# Patient Record
Sex: Male | Born: 1950 | Race: White | Hispanic: No | Marital: Single | Smoking: Never smoker
Health system: Southern US, Community
[De-identification: ages and names within clinical notes are randomized; demographics above are authoritative.]

---

## 2015-01-29 ENCOUNTER — Other Ambulatory Visit (HOSPITAL_COMMUNITY): Payer: Self-pay | Admitting: Urology

## 2015-01-29 DIAGNOSIS — R972 Elevated prostate specific antigen [PSA]: Secondary | ICD-10-CM

## 2015-02-16 ENCOUNTER — Ambulatory Visit (HOSPITAL_COMMUNITY)
Admission: RE | Admit: 2015-02-16 | Discharge: 2015-02-16 | Disposition: A | Discharge status: Home / Self Care | Payer: PRIVATE HEALTH INSURANCE | Source: Ambulatory Visit | Attending: Urology | Admitting: Urology

## 2015-02-16 DIAGNOSIS — R972 Elevated prostate specific antigen [PSA]: Secondary | ICD-10-CM | POA: Diagnosis not present

## 2015-02-16 DIAGNOSIS — N4289 Other specified disorders of prostate: Secondary | ICD-10-CM | POA: Diagnosis not present

## 2015-02-16 LAB — POCT I-STAT, CHEM 8
BUN: 11 mg/dL (ref 6–20)
CALCIUM ION: 1.12 mmol/L — AB (ref 1.13–1.30)
Chloride: 102 mmol/L (ref 101–111)
Creatinine, Ser: 0.9 mg/dL (ref 0.61–1.24)
GLUCOSE: 89 mg/dL (ref 65–99)
HEMATOCRIT: 43 % (ref 39.0–52.0)
HEMOGLOBIN: 14.6 g/dL (ref 13.0–17.0)
POTASSIUM: 4.4 mmol/L (ref 3.5–5.1)
Sodium: 140 mmol/L (ref 135–145)
TCO2: 28 mmol/L (ref 0–100)

## 2015-02-16 MED ORDER — GADOBENATE DIMEGLUMINE 529 MG/ML IV SOLN
16.0000 mL | Freq: Once | INTRAVENOUS | Status: AC | PRN
  Administered 2015-02-16: 16 mL via INTRAVENOUS

## 2015-10-18 DIAGNOSIS — J019 Acute sinusitis, unspecified: Secondary | ICD-10-CM | POA: Diagnosis not present

## 2015-10-25 DIAGNOSIS — R972 Elevated prostate specific antigen [PSA]: Secondary | ICD-10-CM | POA: Diagnosis not present

## 2015-10-25 DIAGNOSIS — N401 Enlarged prostate with lower urinary tract symptoms: Secondary | ICD-10-CM | POA: Diagnosis not present

## 2015-11-18 DIAGNOSIS — I10 Essential (primary) hypertension: Secondary | ICD-10-CM | POA: Diagnosis not present

## 2016-01-18 DIAGNOSIS — Z0000 Encounter for general adult medical examination without abnormal findings: Secondary | ICD-10-CM | POA: Diagnosis not present

## 2016-01-18 DIAGNOSIS — Z6825 Body mass index (BMI) 25.0-25.9, adult: Secondary | ICD-10-CM | POA: Diagnosis not present

## 2016-01-18 DIAGNOSIS — Z9181 History of falling: Secondary | ICD-10-CM | POA: Diagnosis not present

## 2016-01-18 DIAGNOSIS — Z79899 Other long term (current) drug therapy: Secondary | ICD-10-CM | POA: Diagnosis not present

## 2016-01-18 DIAGNOSIS — E785 Hyperlipidemia, unspecified: Secondary | ICD-10-CM | POA: Diagnosis not present

## 2016-01-18 DIAGNOSIS — Z1389 Encounter for screening for other disorder: Secondary | ICD-10-CM | POA: Diagnosis not present

## 2016-01-18 DIAGNOSIS — I10 Essential (primary) hypertension: Secondary | ICD-10-CM | POA: Diagnosis not present

## 2016-01-18 DIAGNOSIS — R739 Hyperglycemia, unspecified: Secondary | ICD-10-CM | POA: Diagnosis not present

## 2016-01-18 DIAGNOSIS — N400 Enlarged prostate without lower urinary tract symptoms: Secondary | ICD-10-CM | POA: Diagnosis not present

## 2016-02-02 DIAGNOSIS — H25813 Combined forms of age-related cataract, bilateral: Secondary | ICD-10-CM | POA: Diagnosis not present

## 2016-02-02 DIAGNOSIS — I10 Essential (primary) hypertension: Secondary | ICD-10-CM | POA: Diagnosis not present

## 2016-04-24 DIAGNOSIS — R972 Elevated prostate specific antigen [PSA]: Secondary | ICD-10-CM | POA: Diagnosis not present

## 2016-04-24 DIAGNOSIS — N401 Enlarged prostate with lower urinary tract symptoms: Secondary | ICD-10-CM | POA: Diagnosis not present

## 2016-07-07 DIAGNOSIS — R69 Illness, unspecified: Secondary | ICD-10-CM | POA: Diagnosis not present

## 2016-07-18 IMAGING — MR MR PROSTATE WO/W CM
23 of 53 series · 23 of 53 positions shown · IV contrast (yes)
Comparison: None.

CLINICAL DATA: Persistent elevated serum PSA levels. Prior negative
prostate biopsy.

EXAM:
MR PROSTATE WITHOUT AND WITH CONTRAST
TECHNIQUE: Multiplanar multisequence MRI images were obtained of the pelvis
centered about the prostate. Pre and post contrast images were
obtained.
CONTRAST:  16mL MULTIHANCE GADOBENATE DIMEGLUMINE 529 MG/ML IV SOLN

[Series 3: bSSFP fat-sat · axial · 6.0mm · 0.86mm/px · 1 of 44 slices shown]
[im 1/44]
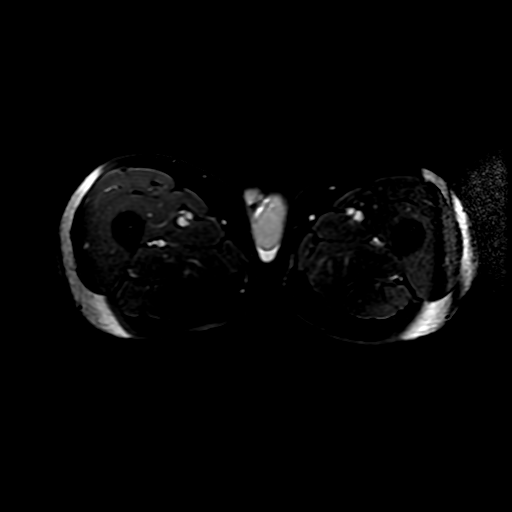

[Series 4: T1 · axial · 6.0mm · 0.86mm/px · 1 of 43 slices shown (1 of 2)]
[im 1/43]
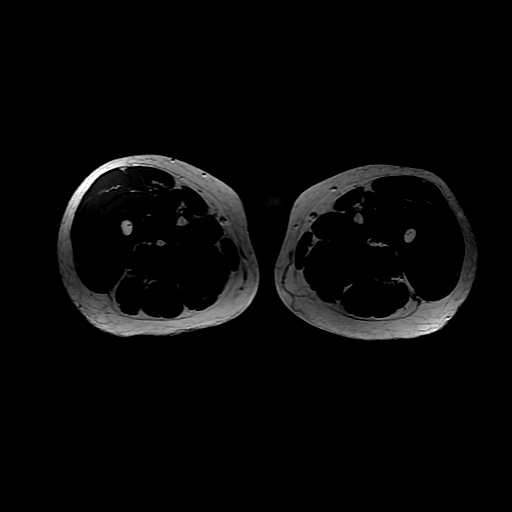

[Series 5: T2 · axial · 3.0mm · 0.29mm/px · 1 of 30 slices shown (1 of 3)]
[im 1/30]
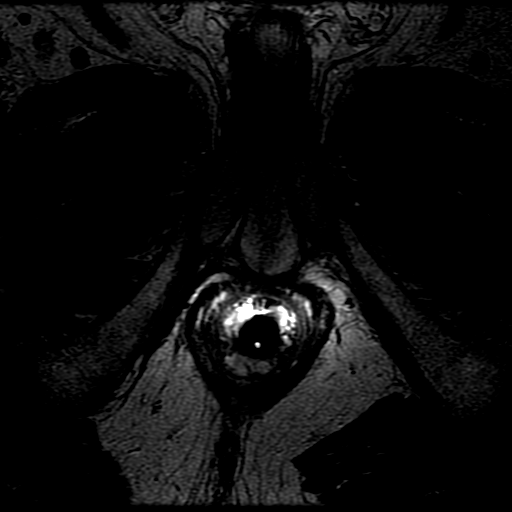

[Series 6: T1 · axial · 3.0mm · 0.29mm/px · 1 of 30 slices shown (2 of 2)]
[im 1/30]
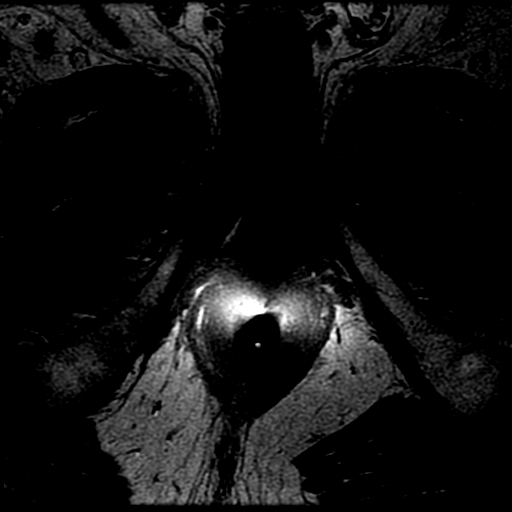

[Series 7: T2 · sagittal · 4.0mm · 0.29mm/px · 1 of 25 slices shown (2 of 3)]
[im 1/25]
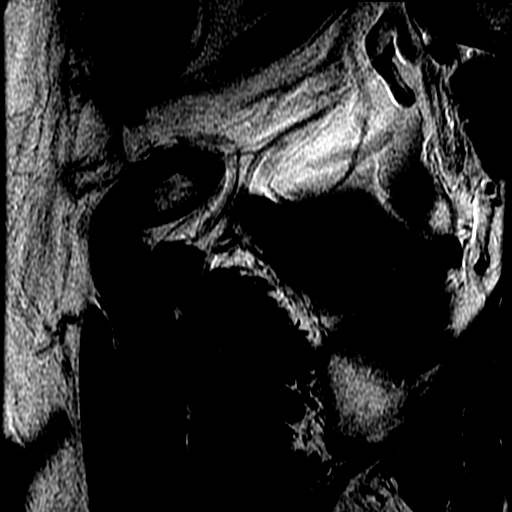

[Series 8: T2 · coronal · 4.0mm · 0.29mm/px · 1 of 18 slices shown (3 of 3)]
[im 1/18]
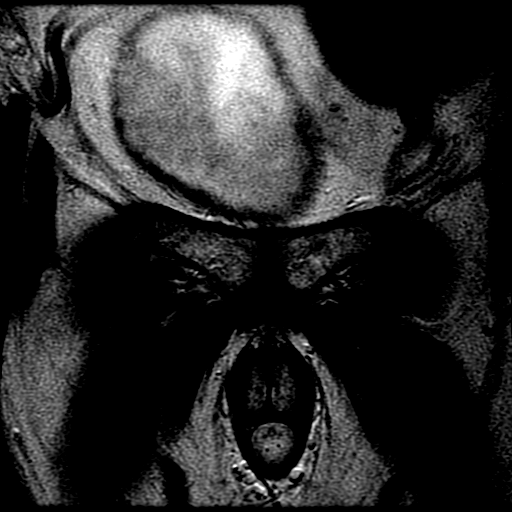

[Series 9: DWI · axial · 3.0mm · 0.59mm/px · 1 of 50 slices shown (1 of 6)]
[im 1/50]
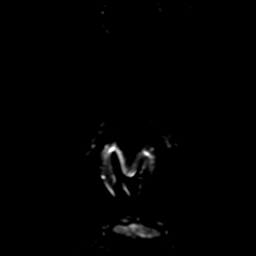

[Series 10: DWI · axial · 3.0mm · 0.59mm/px · 1 of 48 slices shown (2 of 6)]
[im 1/48]
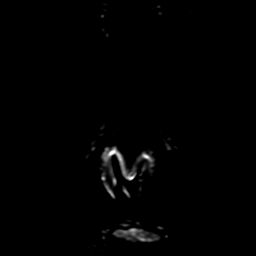

[Series 11: DWI · axial · 3.0mm · 0.59mm/px · 1 of 47 slices shown (3 of 6)]
[im 1/47]
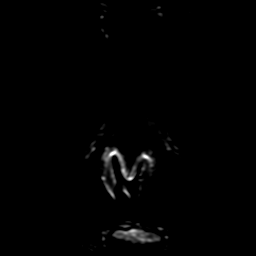

[Series 900: DWI · axial · 3.0mm · 0.59mm/px · 1 of 25 slices shown (4 of 6)]
[im 1/25]
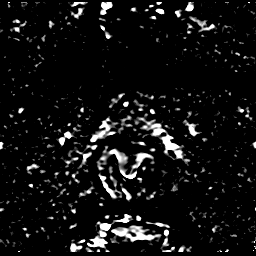

[Series 1000: DWI · axial · 3.0mm · 0.59mm/px · 1 of 25 slices shown (5 of 6)]
[im 1/25]
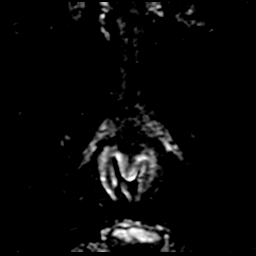

[Series 1100: DWI · axial · 3.0mm · 0.59mm/px · 1 of 25 slices shown (6 of 6)]
[im 1/25]
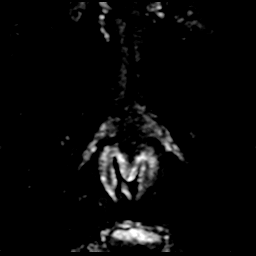

[((id)/(id)/1)-((id)/(id)/1) · axial · 3.0mm · 0.43mm/px · 1 of 75 slices shown (1 of 11)]
[im 1/75]
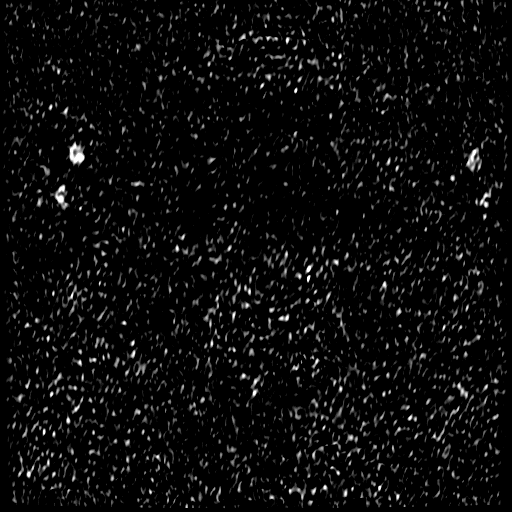

[((id)/(id)/1)-((id)/(id)/1) · axial · 3.0mm · 0.43mm/px · 1 of 68 slices shown (2 of 11)]
[im 1/68]
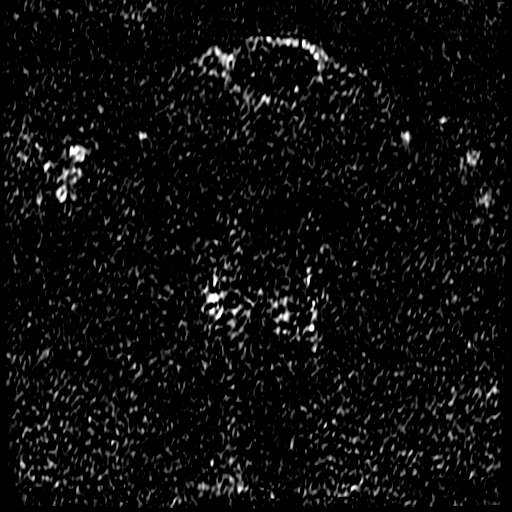

[((id)/(id)/1)-((id)/(id)/1) · axial · 3.0mm · 0.43mm/px · 1 of 69 slices shown (3 of 11)]
[im 1/69]
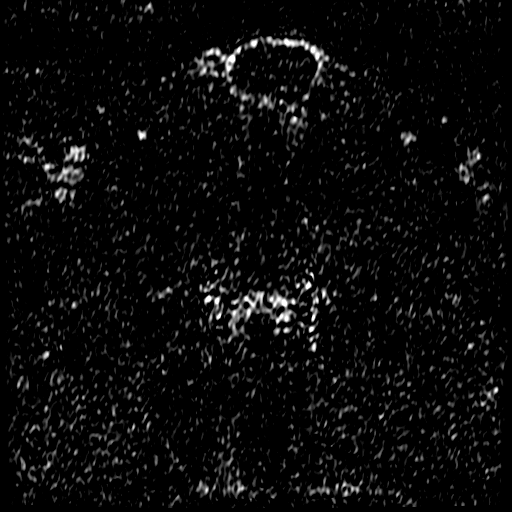

[((id)/(id)/1)-((id)/(id)/1) · axial · 3.0mm · 0.43mm/px · 1 of 70 slices shown (4 of 11)]
[im 1/70]
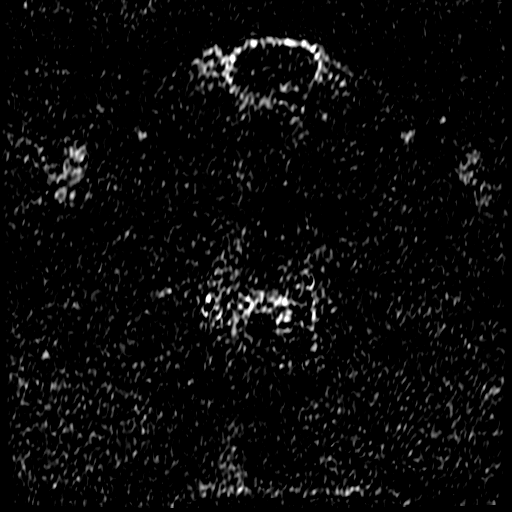

[((id)/(id)/1)-((id)/(id)/1) · axial · 3.0mm · 0.43mm/px · 1 of 70 slices shown (5 of 11)]
[im 1/70]
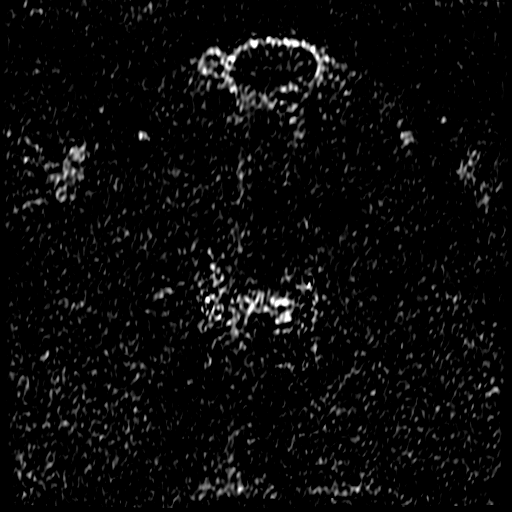

[((id)/(id)/1)-((id)/(id)/1) · axial · 3.0mm · 0.43mm/px · 1 of 71 slices shown (6 of 11)]
[im 1/71]
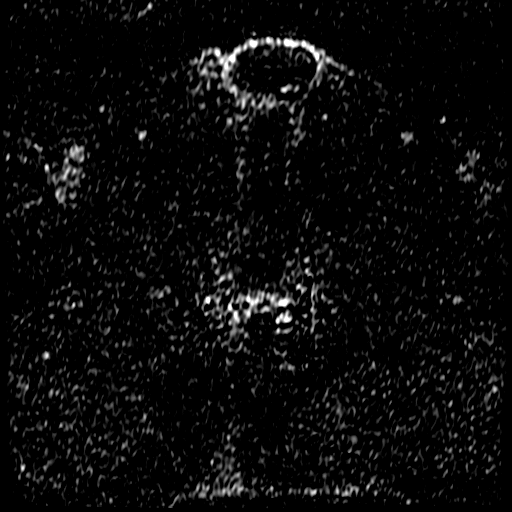

[((id)/(id)/1)-((id)/(id)/1) · axial · 3.0mm · 0.43mm/px · 1 of 72 slices shown (7 of 11)]
[im 1/72]
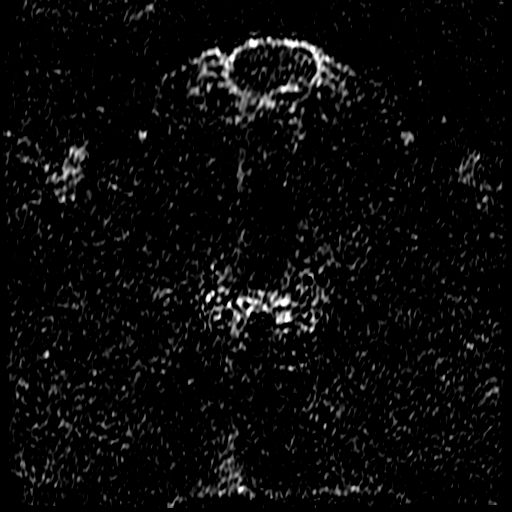

[((id)/(id)/1)-((id)/(id)/1) · axial · 3.0mm · 0.43mm/px · 1 of 72 slices shown (8 of 11)]
[im 1/72]
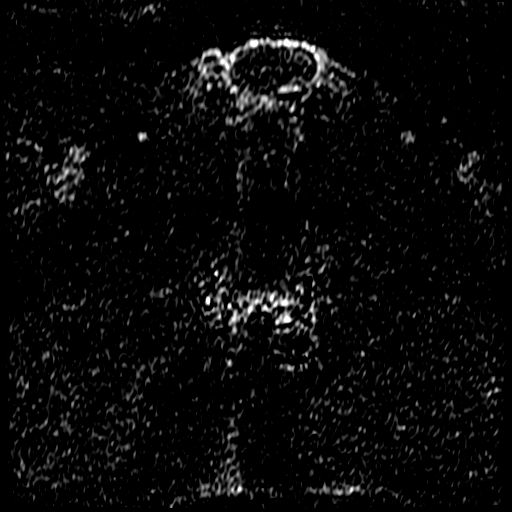

[((id)/(id)/1)-((id)/(id)/1) · axial · 3.0mm · 0.43mm/px · 1 of 75 slices shown (9 of 11)]
[im 1/75]
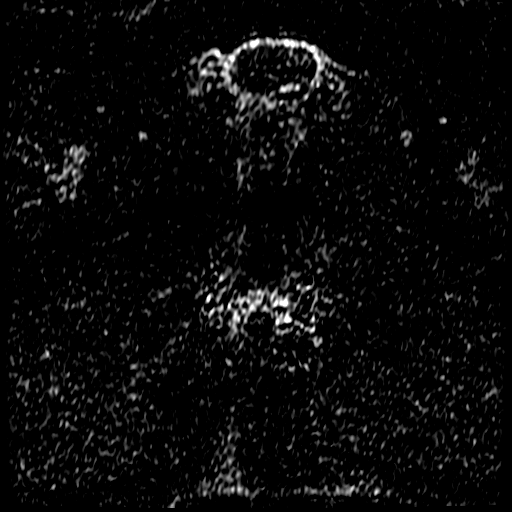

[((id)/(id)/1)-((id)/(id)/1) · axial · 3.0mm · 0.43mm/px · 1 of 75 slices shown (10 of 11)]
[im 1/75]
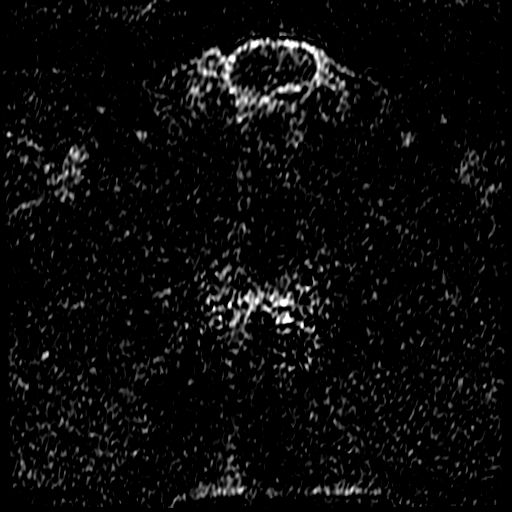

[((id)/(id)/1)-((id)/(id)/1) · axial · 3.0mm · 0.43mm/px · 1 of 75 slices shown (11 of 11)]
[im 1/75]
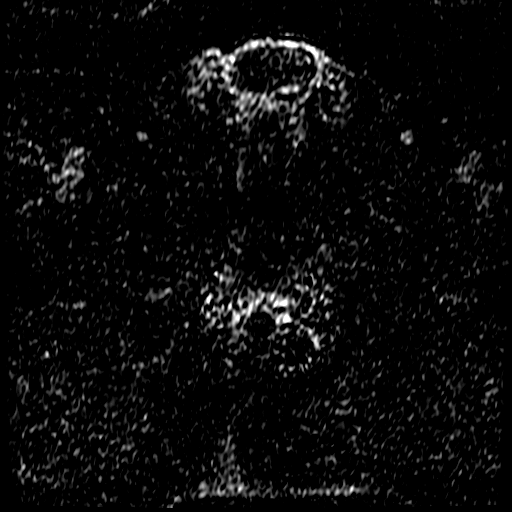

[23 of 53 positions shown; findings below may reference images not displayed]

FINDINGS: Prostate: Mildly enlarged. Multiple small BPH nodules seen
throughout the central gland, without suspicious features. Small
benign prostatic utricle cyst also seen posteriorly measuring 9 mm.

The right sided peripheral zone shows diffuse atrophy, without
evidence of focal T2 hypointense or hypervascular nodule. No
evidence of ADC restricted diffusion.

The left sided peripheral zone is normal in appearance, without
evidence of focal nodules, ADC restricted diffusion, or
hypervascularity.

Transcapsular spread:  Absent

Seminal vesicle involvement: Absent

Neurovascular bundle involvement: Absent

Pelvic adenopathy: Absent

Bone metastasis: Absent

Other findings: None
IMPRESSION: No evidence of clinically significant macroscopic prostate
carcinoma.

Diffuse right sided peripheral zone atrophy, consistent with chronic
prostatitis.

## 2016-08-04 DIAGNOSIS — H5203 Hypermetropia, bilateral: Secondary | ICD-10-CM | POA: Diagnosis not present

## 2016-08-04 DIAGNOSIS — Z0100 Encounter for examination of eyes and vision without abnormal findings: Secondary | ICD-10-CM | POA: Diagnosis not present

## 2016-08-08 DIAGNOSIS — R42 Dizziness and giddiness: Secondary | ICD-10-CM | POA: Diagnosis not present

## 2016-08-08 DIAGNOSIS — M12812 Other specific arthropathies, not elsewhere classified, left shoulder: Secondary | ICD-10-CM | POA: Diagnosis not present

## 2016-10-25 DIAGNOSIS — R972 Elevated prostate specific antigen [PSA]: Secondary | ICD-10-CM | POA: Diagnosis not present

## 2016-10-25 DIAGNOSIS — N401 Enlarged prostate with lower urinary tract symptoms: Secondary | ICD-10-CM | POA: Diagnosis not present

## 2017-02-05 DIAGNOSIS — N401 Enlarged prostate with lower urinary tract symptoms: Secondary | ICD-10-CM | POA: Diagnosis not present

## 2017-02-05 DIAGNOSIS — K4090 Unilateral inguinal hernia, without obstruction or gangrene, not specified as recurrent: Secondary | ICD-10-CM | POA: Diagnosis not present

## 2017-02-05 DIAGNOSIS — R972 Elevated prostate specific antigen [PSA]: Secondary | ICD-10-CM | POA: Diagnosis not present

## 2017-02-20 DIAGNOSIS — E7800 Pure hypercholesterolemia, unspecified: Secondary | ICD-10-CM | POA: Diagnosis not present

## 2017-02-20 DIAGNOSIS — K4090 Unilateral inguinal hernia, without obstruction or gangrene, not specified as recurrent: Secondary | ICD-10-CM | POA: Diagnosis not present

## 2017-02-20 DIAGNOSIS — I10 Essential (primary) hypertension: Secondary | ICD-10-CM | POA: Diagnosis not present

## 2017-02-20 DIAGNOSIS — E039 Hypothyroidism, unspecified: Secondary | ICD-10-CM | POA: Diagnosis not present

## 2017-02-26 DIAGNOSIS — E039 Hypothyroidism, unspecified: Secondary | ICD-10-CM | POA: Diagnosis not present

## 2017-02-26 DIAGNOSIS — Z0000 Encounter for general adult medical examination without abnormal findings: Secondary | ICD-10-CM | POA: Diagnosis not present

## 2017-02-26 DIAGNOSIS — R739 Hyperglycemia, unspecified: Secondary | ICD-10-CM | POA: Diagnosis not present

## 2017-02-26 DIAGNOSIS — N400 Enlarged prostate without lower urinary tract symptoms: Secondary | ICD-10-CM | POA: Diagnosis not present

## 2017-02-26 DIAGNOSIS — Z79899 Other long term (current) drug therapy: Secondary | ICD-10-CM | POA: Diagnosis not present

## 2017-02-26 DIAGNOSIS — E781 Pure hyperglyceridemia: Secondary | ICD-10-CM | POA: Diagnosis not present

## 2017-02-26 DIAGNOSIS — R972 Elevated prostate specific antigen [PSA]: Secondary | ICD-10-CM | POA: Diagnosis not present

## 2017-02-26 DIAGNOSIS — K219 Gastro-esophageal reflux disease without esophagitis: Secondary | ICD-10-CM | POA: Diagnosis not present

## 2017-02-26 DIAGNOSIS — Z6824 Body mass index (BMI) 24.0-24.9, adult: Secondary | ICD-10-CM | POA: Diagnosis not present

## 2017-02-26 DIAGNOSIS — I10 Essential (primary) hypertension: Secondary | ICD-10-CM | POA: Diagnosis not present

## 2017-04-25 DIAGNOSIS — N401 Enlarged prostate with lower urinary tract symptoms: Secondary | ICD-10-CM | POA: Diagnosis not present

## 2017-04-25 DIAGNOSIS — R972 Elevated prostate specific antigen [PSA]: Secondary | ICD-10-CM | POA: Diagnosis not present

## 2017-06-15 DIAGNOSIS — R69 Illness, unspecified: Secondary | ICD-10-CM | POA: Diagnosis not present

## 2017-08-08 DIAGNOSIS — R05 Cough: Secondary | ICD-10-CM | POA: Diagnosis not present

## 2017-08-08 DIAGNOSIS — Z6824 Body mass index (BMI) 24.0-24.9, adult: Secondary | ICD-10-CM | POA: Diagnosis not present

## 2017-08-08 DIAGNOSIS — J019 Acute sinusitis, unspecified: Secondary | ICD-10-CM | POA: Diagnosis not present

## 2017-08-08 DIAGNOSIS — R0982 Postnasal drip: Secondary | ICD-10-CM | POA: Diagnosis not present

## 2017-08-22 DIAGNOSIS — H5203 Hypermetropia, bilateral: Secondary | ICD-10-CM | POA: Diagnosis not present

## 2017-09-26 DIAGNOSIS — K4090 Unilateral inguinal hernia, without obstruction or gangrene, not specified as recurrent: Secondary | ICD-10-CM | POA: Diagnosis not present

## 2017-10-25 ENCOUNTER — Other Ambulatory Visit (HOSPITAL_COMMUNITY)
Admission: RE | Admit: 2017-10-25 | Discharge: 2017-10-25 | Disposition: A | Discharge status: Home / Self Care | Payer: PRIVATE HEALTH INSURANCE | Source: Ambulatory Visit | Attending: Family Medicine | Admitting: Family Medicine

## 2017-10-25 DIAGNOSIS — K4090 Unilateral inguinal hernia, without obstruction or gangrene, not specified as recurrent: Secondary | ICD-10-CM | POA: Diagnosis not present

## 2017-10-25 DIAGNOSIS — I10 Essential (primary) hypertension: Secondary | ICD-10-CM | POA: Diagnosis not present

## 2017-10-25 DIAGNOSIS — E039 Hypothyroidism, unspecified: Secondary | ICD-10-CM | POA: Diagnosis not present

## 2017-10-25 DIAGNOSIS — D176 Benign lipomatous neoplasm of spermatic cord: Secondary | ICD-10-CM | POA: Diagnosis not present

## 2017-10-25 DIAGNOSIS — E7800 Pure hypercholesterolemia, unspecified: Secondary | ICD-10-CM | POA: Diagnosis not present

## 2017-10-25 DIAGNOSIS — N401 Enlarged prostate with lower urinary tract symptoms: Secondary | ICD-10-CM | POA: Diagnosis not present

## 2017-10-25 DIAGNOSIS — Z791 Long term (current) use of non-steroidal anti-inflammatories (NSAID): Secondary | ICD-10-CM | POA: Diagnosis not present

## 2017-10-25 DIAGNOSIS — C8330 Diffuse large B-cell lymphoma, unspecified site: Secondary | ICD-10-CM | POA: Insufficient documentation

## 2017-10-25 DIAGNOSIS — Z79899 Other long term (current) drug therapy: Secondary | ICD-10-CM | POA: Diagnosis not present

## 2017-10-25 DIAGNOSIS — C8339 Diffuse large B-cell lymphoma, extranodal and solid organ sites: Secondary | ICD-10-CM | POA: Diagnosis not present

## 2017-10-25 DIAGNOSIS — M1990 Unspecified osteoarthritis, unspecified site: Secondary | ICD-10-CM | POA: Diagnosis not present

## 2017-10-25 DIAGNOSIS — K219 Gastro-esophageal reflux disease without esophagitis: Secondary | ICD-10-CM | POA: Diagnosis not present

## 2017-11-05 DIAGNOSIS — N401 Enlarged prostate with lower urinary tract symptoms: Secondary | ICD-10-CM | POA: Diagnosis not present

## 2017-11-05 DIAGNOSIS — R972 Elevated prostate specific antigen [PSA]: Secondary | ICD-10-CM | POA: Diagnosis not present

## 2017-11-07 DIAGNOSIS — Z09 Encounter for follow-up examination after completed treatment for conditions other than malignant neoplasm: Secondary | ICD-10-CM | POA: Diagnosis not present

## 2017-11-09 DIAGNOSIS — C8510 Unspecified B-cell lymphoma, unspecified site: Secondary | ICD-10-CM | POA: Diagnosis not present

## 2017-11-09 DIAGNOSIS — C8590 Non-Hodgkin lymphoma, unspecified, unspecified site: Secondary | ICD-10-CM | POA: Diagnosis not present

## 2017-11-09 DIAGNOSIS — I700 Atherosclerosis of aorta: Secondary | ICD-10-CM | POA: Diagnosis not present

## 2017-11-09 DIAGNOSIS — N210 Calculus in bladder: Secondary | ICD-10-CM | POA: Diagnosis not present

## 2017-11-13 DIAGNOSIS — Z79899 Other long term (current) drug therapy: Secondary | ICD-10-CM | POA: Diagnosis not present

## 2017-11-13 DIAGNOSIS — K219 Gastro-esophageal reflux disease without esophagitis: Secondary | ICD-10-CM | POA: Diagnosis not present

## 2017-11-13 DIAGNOSIS — R972 Elevated prostate specific antigen [PSA]: Secondary | ICD-10-CM | POA: Diagnosis not present

## 2017-11-13 DIAGNOSIS — N400 Enlarged prostate without lower urinary tract symptoms: Secondary | ICD-10-CM | POA: Diagnosis not present

## 2017-11-13 DIAGNOSIS — E785 Hyperlipidemia, unspecified: Secondary | ICD-10-CM | POA: Diagnosis not present

## 2017-11-13 DIAGNOSIS — E039 Hypothyroidism, unspecified: Secondary | ICD-10-CM | POA: Diagnosis not present

## 2017-11-13 DIAGNOSIS — C8333 Diffuse large B-cell lymphoma, intra-abdominal lymph nodes: Secondary | ICD-10-CM | POA: Diagnosis not present

## 2017-11-13 DIAGNOSIS — M1990 Unspecified osteoarthritis, unspecified site: Secondary | ICD-10-CM | POA: Diagnosis not present

## 2017-11-14 DIAGNOSIS — Z6824 Body mass index (BMI) 24.0-24.9, adult: Secondary | ICD-10-CM | POA: Diagnosis not present

## 2017-11-14 DIAGNOSIS — C8590 Non-Hodgkin lymphoma, unspecified, unspecified site: Secondary | ICD-10-CM | POA: Diagnosis not present

## 2017-11-21 DIAGNOSIS — I517 Cardiomegaly: Secondary | ICD-10-CM | POA: Diagnosis not present

## 2017-11-21 DIAGNOSIS — T451X1A Poisoning by antineoplastic and immunosuppressive drugs, accidental (unintentional), initial encounter: Secondary | ICD-10-CM | POA: Diagnosis not present

## 2017-11-21 DIAGNOSIS — C8330 Diffuse large B-cell lymphoma, unspecified site: Secondary | ICD-10-CM | POA: Diagnosis not present

## 2017-11-22 DIAGNOSIS — Z452 Encounter for adjustment and management of vascular access device: Secondary | ICD-10-CM | POA: Diagnosis not present

## 2017-11-22 DIAGNOSIS — N401 Enlarged prostate with lower urinary tract symptoms: Secondary | ICD-10-CM | POA: Diagnosis not present

## 2017-11-22 DIAGNOSIS — E039 Hypothyroidism, unspecified: Secondary | ICD-10-CM | POA: Diagnosis not present

## 2017-11-22 DIAGNOSIS — Z79899 Other long term (current) drug therapy: Secondary | ICD-10-CM | POA: Diagnosis not present

## 2017-11-22 DIAGNOSIS — C8590 Non-Hodgkin lymphoma, unspecified, unspecified site: Secondary | ICD-10-CM | POA: Diagnosis not present

## 2017-11-22 DIAGNOSIS — K219 Gastro-esophageal reflux disease without esophagitis: Secondary | ICD-10-CM | POA: Diagnosis not present

## 2017-11-23 DIAGNOSIS — C8330 Diffuse large B-cell lymphoma, unspecified site: Secondary | ICD-10-CM | POA: Diagnosis not present

## 2017-11-23 DIAGNOSIS — Z5111 Encounter for antineoplastic chemotherapy: Secondary | ICD-10-CM | POA: Diagnosis not present

## 2017-11-26 DIAGNOSIS — C8330 Diffuse large B-cell lymphoma, unspecified site: Secondary | ICD-10-CM | POA: Diagnosis not present

## 2017-11-26 DIAGNOSIS — Z5189 Encounter for other specified aftercare: Secondary | ICD-10-CM | POA: Diagnosis not present

## 2017-11-28 DIAGNOSIS — Z09 Encounter for follow-up examination after completed treatment for conditions other than malignant neoplasm: Secondary | ICD-10-CM | POA: Diagnosis not present

## 2017-11-30 DIAGNOSIS — C8338 Diffuse large B-cell lymphoma, lymph nodes of multiple sites: Secondary | ICD-10-CM | POA: Diagnosis not present

## 2017-11-30 DIAGNOSIS — R300 Dysuria: Secondary | ICD-10-CM | POA: Diagnosis not present

## 2017-12-03 ENCOUNTER — Encounter (HOSPITAL_COMMUNITY): Payer: Self-pay | Admitting: Surgery

## 2017-12-04 LAB — TISSUE HYBRIDIZATION TO NCBH

## 2017-12-07 DIAGNOSIS — M79662 Pain in left lower leg: Secondary | ICD-10-CM | POA: Diagnosis not present

## 2017-12-07 DIAGNOSIS — C8330 Diffuse large B-cell lymphoma, unspecified site: Secondary | ICD-10-CM | POA: Diagnosis not present

## 2017-12-17 DIAGNOSIS — R6889 Other general symptoms and signs: Secondary | ICD-10-CM | POA: Diagnosis not present

## 2017-12-17 DIAGNOSIS — E46 Unspecified protein-calorie malnutrition: Secondary | ICD-10-CM | POA: Diagnosis not present

## 2017-12-17 DIAGNOSIS — C8330 Diffuse large B-cell lymphoma, unspecified site: Secondary | ICD-10-CM | POA: Diagnosis not present

## 2017-12-17 DIAGNOSIS — C8333 Diffuse large B-cell lymphoma, intra-abdominal lymph nodes: Secondary | ICD-10-CM | POA: Diagnosis not present

## 2017-12-18 DIAGNOSIS — Z5189 Encounter for other specified aftercare: Secondary | ICD-10-CM | POA: Diagnosis not present

## 2017-12-18 DIAGNOSIS — C8330 Diffuse large B-cell lymphoma, unspecified site: Secondary | ICD-10-CM | POA: Diagnosis not present

## 2017-12-20 DIAGNOSIS — R252 Cramp and spasm: Secondary | ICD-10-CM | POA: Diagnosis not present

## 2017-12-20 DIAGNOSIS — C8330 Diffuse large B-cell lymphoma, unspecified site: Secondary | ICD-10-CM | POA: Diagnosis not present

## 2018-01-07 DIAGNOSIS — C8330 Diffuse large B-cell lymphoma, unspecified site: Secondary | ICD-10-CM | POA: Diagnosis not present

## 2018-01-07 DIAGNOSIS — C8333 Diffuse large B-cell lymphoma, intra-abdominal lymph nodes: Secondary | ICD-10-CM | POA: Diagnosis not present

## 2018-01-08 DIAGNOSIS — C8330 Diffuse large B-cell lymphoma, unspecified site: Secondary | ICD-10-CM | POA: Diagnosis not present

## 2018-01-08 DIAGNOSIS — Z5189 Encounter for other specified aftercare: Secondary | ICD-10-CM | POA: Diagnosis not present

## 2018-01-25 DIAGNOSIS — I517 Cardiomegaly: Secondary | ICD-10-CM | POA: Diagnosis not present

## 2018-01-25 DIAGNOSIS — I081 Rheumatic disorders of both mitral and tricuspid valves: Secondary | ICD-10-CM | POA: Diagnosis not present

## 2018-01-25 DIAGNOSIS — T451X1D Poisoning by antineoplastic and immunosuppressive drugs, accidental (unintentional), subsequent encounter: Secondary | ICD-10-CM | POA: Diagnosis not present

## 2018-01-25 DIAGNOSIS — X58XXXD Exposure to other specified factors, subsequent encounter: Secondary | ICD-10-CM | POA: Diagnosis not present

## 2018-01-25 DIAGNOSIS — C8580 Other specified types of non-Hodgkin lymphoma, unspecified site: Secondary | ICD-10-CM | POA: Diagnosis not present

## 2018-01-25 DIAGNOSIS — N210 Calculus in bladder: Secondary | ICD-10-CM | POA: Diagnosis not present

## 2018-01-25 DIAGNOSIS — I700 Atherosclerosis of aorta: Secondary | ICD-10-CM | POA: Diagnosis not present

## 2018-01-25 DIAGNOSIS — C8330 Diffuse large B-cell lymphoma, unspecified site: Secondary | ICD-10-CM | POA: Diagnosis not present

## 2018-01-28 DIAGNOSIS — R252 Cramp and spasm: Secondary | ICD-10-CM | POA: Diagnosis not present

## 2018-01-28 DIAGNOSIS — C8330 Diffuse large B-cell lymphoma, unspecified site: Secondary | ICD-10-CM | POA: Diagnosis not present

## 2018-01-28 DIAGNOSIS — C8333 Diffuse large B-cell lymphoma, intra-abdominal lymph nodes: Secondary | ICD-10-CM | POA: Diagnosis not present

## 2018-01-29 DIAGNOSIS — C8330 Diffuse large B-cell lymphoma, unspecified site: Secondary | ICD-10-CM | POA: Diagnosis not present

## 2018-01-29 DIAGNOSIS — Z5189 Encounter for other specified aftercare: Secondary | ICD-10-CM | POA: Diagnosis not present

## 2018-02-18 DIAGNOSIS — I951 Orthostatic hypotension: Secondary | ICD-10-CM | POA: Diagnosis not present

## 2018-02-18 DIAGNOSIS — Z79899 Other long term (current) drug therapy: Secondary | ICD-10-CM | POA: Diagnosis not present

## 2018-02-18 DIAGNOSIS — C8333 Diffuse large B-cell lymphoma, intra-abdominal lymph nodes: Secondary | ICD-10-CM | POA: Diagnosis not present

## 2018-02-18 DIAGNOSIS — C8330 Diffuse large B-cell lymphoma, unspecified site: Secondary | ICD-10-CM | POA: Diagnosis not present

## 2018-02-19 DIAGNOSIS — C8330 Diffuse large B-cell lymphoma, unspecified site: Secondary | ICD-10-CM | POA: Diagnosis not present

## 2018-02-19 DIAGNOSIS — Z5189 Encounter for other specified aftercare: Secondary | ICD-10-CM | POA: Diagnosis not present

## 2018-03-04 DIAGNOSIS — D649 Anemia, unspecified: Secondary | ICD-10-CM | POA: Diagnosis not present

## 2018-03-04 DIAGNOSIS — E871 Hypo-osmolality and hyponatremia: Secondary | ICD-10-CM | POA: Diagnosis not present

## 2018-03-04 DIAGNOSIS — R300 Dysuria: Secondary | ICD-10-CM | POA: Diagnosis not present

## 2018-03-04 DIAGNOSIS — C8330 Diffuse large B-cell lymphoma, unspecified site: Secondary | ICD-10-CM | POA: Diagnosis not present

## 2018-03-04 DIAGNOSIS — E860 Dehydration: Secondary | ICD-10-CM | POA: Diagnosis not present

## 2018-03-04 DIAGNOSIS — R509 Fever, unspecified: Secondary | ICD-10-CM | POA: Diagnosis not present

## 2018-03-04 DIAGNOSIS — E538 Deficiency of other specified B group vitamins: Secondary | ICD-10-CM | POA: Diagnosis not present

## 2018-03-11 DIAGNOSIS — C8330 Diffuse large B-cell lymphoma, unspecified site: Secondary | ICD-10-CM | POA: Diagnosis not present

## 2018-03-12 DIAGNOSIS — C8338 Diffuse large B-cell lymphoma, lymph nodes of multiple sites: Secondary | ICD-10-CM | POA: Diagnosis not present

## 2018-03-19 DIAGNOSIS — C8330 Diffuse large B-cell lymphoma, unspecified site: Secondary | ICD-10-CM | POA: Diagnosis not present

## 2018-03-20 DIAGNOSIS — E781 Pure hyperglyceridemia: Secondary | ICD-10-CM | POA: Diagnosis not present

## 2018-03-20 DIAGNOSIS — I10 Essential (primary) hypertension: Secondary | ICD-10-CM | POA: Diagnosis not present

## 2018-03-20 DIAGNOSIS — E039 Hypothyroidism, unspecified: Secondary | ICD-10-CM | POA: Diagnosis not present

## 2018-03-20 DIAGNOSIS — B370 Candidal stomatitis: Secondary | ICD-10-CM | POA: Diagnosis not present

## 2018-03-20 DIAGNOSIS — R739 Hyperglycemia, unspecified: Secondary | ICD-10-CM | POA: Diagnosis not present

## 2018-03-20 DIAGNOSIS — K219 Gastro-esophageal reflux disease without esophagitis: Secondary | ICD-10-CM | POA: Diagnosis not present

## 2018-03-20 DIAGNOSIS — Z79899 Other long term (current) drug therapy: Secondary | ICD-10-CM | POA: Diagnosis not present

## 2018-03-20 DIAGNOSIS — Z1159 Encounter for screening for other viral diseases: Secondary | ICD-10-CM | POA: Diagnosis not present

## 2018-03-20 DIAGNOSIS — Z0000 Encounter for general adult medical examination without abnormal findings: Secondary | ICD-10-CM | POA: Diagnosis not present

## 2018-03-20 DIAGNOSIS — J329 Chronic sinusitis, unspecified: Secondary | ICD-10-CM | POA: Diagnosis not present

## 2018-03-20 DIAGNOSIS — C8330 Diffuse large B-cell lymphoma, unspecified site: Secondary | ICD-10-CM | POA: Diagnosis not present

## 2018-03-24 DIAGNOSIS — R112 Nausea with vomiting, unspecified: Secondary | ICD-10-CM | POA: Diagnosis not present

## 2018-03-24 DIAGNOSIS — E039 Hypothyroidism, unspecified: Secondary | ICD-10-CM | POA: Diagnosis not present

## 2018-03-24 DIAGNOSIS — E785 Hyperlipidemia, unspecified: Secondary | ICD-10-CM | POA: Diagnosis not present

## 2018-03-24 DIAGNOSIS — Z79899 Other long term (current) drug therapy: Secondary | ICD-10-CM | POA: Diagnosis not present

## 2018-03-24 DIAGNOSIS — R918 Other nonspecific abnormal finding of lung field: Secondary | ICD-10-CM | POA: Diagnosis not present

## 2018-03-24 DIAGNOSIS — C8590 Non-Hodgkin lymphoma, unspecified, unspecified site: Secondary | ICD-10-CM | POA: Diagnosis not present

## 2018-03-24 DIAGNOSIS — K810 Acute cholecystitis: Secondary | ICD-10-CM | POA: Diagnosis not present

## 2018-03-24 DIAGNOSIS — Z8572 Personal history of non-Hodgkin lymphomas: Secondary | ICD-10-CM | POA: Diagnosis not present

## 2018-03-24 DIAGNOSIS — R1013 Epigastric pain: Secondary | ICD-10-CM | POA: Diagnosis not present

## 2018-03-24 DIAGNOSIS — A419 Sepsis, unspecified organism: Secondary | ICD-10-CM | POA: Diagnosis not present

## 2018-03-24 DIAGNOSIS — J9811 Atelectasis: Secondary | ICD-10-CM | POA: Diagnosis not present

## 2018-03-24 DIAGNOSIS — I10 Essential (primary) hypertension: Secondary | ICD-10-CM | POA: Diagnosis not present

## 2018-03-24 DIAGNOSIS — M1990 Unspecified osteoarthritis, unspecified site: Secondary | ICD-10-CM | POA: Diagnosis not present

## 2018-03-24 DIAGNOSIS — K219 Gastro-esophageal reflux disease without esophagitis: Secondary | ICD-10-CM | POA: Diagnosis not present

## 2018-03-24 DIAGNOSIS — R509 Fever, unspecified: Secondary | ICD-10-CM | POA: Diagnosis not present

## 2018-03-24 DIAGNOSIS — K838 Other specified diseases of biliary tract: Secondary | ICD-10-CM | POA: Diagnosis not present

## 2018-04-09 DIAGNOSIS — Z09 Encounter for follow-up examination after completed treatment for conditions other than malignant neoplasm: Secondary | ICD-10-CM | POA: Diagnosis not present

## 2018-04-09 DIAGNOSIS — Z6824 Body mass index (BMI) 24.0-24.9, adult: Secondary | ICD-10-CM | POA: Diagnosis not present

## 2018-04-17 DIAGNOSIS — C8330 Diffuse large B-cell lymphoma, unspecified site: Secondary | ICD-10-CM | POA: Diagnosis not present

## 2018-04-18 DIAGNOSIS — D649 Anemia, unspecified: Secondary | ICD-10-CM | POA: Diagnosis not present

## 2018-04-18 DIAGNOSIS — E778 Other disorders of glycoprotein metabolism: Secondary | ICD-10-CM

## 2018-04-18 DIAGNOSIS — C8330 Diffuse large B-cell lymphoma, unspecified site: Secondary | ICD-10-CM | POA: Diagnosis not present

## 2018-04-18 DIAGNOSIS — C8333 Diffuse large B-cell lymphoma, intra-abdominal lymph nodes: Secondary | ICD-10-CM | POA: Diagnosis not present

## 2018-04-18 DIAGNOSIS — R609 Edema, unspecified: Secondary | ICD-10-CM | POA: Diagnosis not present

## 2018-04-18 DIAGNOSIS — R600 Localized edema: Secondary | ICD-10-CM | POA: Diagnosis not present

## 2018-04-18 DIAGNOSIS — E876 Hypokalemia: Secondary | ICD-10-CM | POA: Diagnosis not present

## 2018-04-18 DIAGNOSIS — Z9221 Personal history of antineoplastic chemotherapy: Secondary | ICD-10-CM | POA: Diagnosis not present

## 2018-04-19 DIAGNOSIS — C8330 Diffuse large B-cell lymphoma, unspecified site: Secondary | ICD-10-CM | POA: Diagnosis not present

## 2018-04-25 DIAGNOSIS — C8330 Diffuse large B-cell lymphoma, unspecified site: Secondary | ICD-10-CM | POA: Diagnosis not present

## 2018-04-25 DIAGNOSIS — Z510 Encounter for antineoplastic radiation therapy: Secondary | ICD-10-CM | POA: Diagnosis not present

## 2018-05-01 DIAGNOSIS — Z510 Encounter for antineoplastic radiation therapy: Secondary | ICD-10-CM | POA: Diagnosis not present

## 2018-05-01 DIAGNOSIS — C8330 Diffuse large B-cell lymphoma, unspecified site: Secondary | ICD-10-CM | POA: Diagnosis not present

## 2018-05-02 DIAGNOSIS — Z510 Encounter for antineoplastic radiation therapy: Secondary | ICD-10-CM | POA: Diagnosis not present

## 2018-05-02 DIAGNOSIS — C8330 Diffuse large B-cell lymphoma, unspecified site: Secondary | ICD-10-CM | POA: Diagnosis not present

## 2018-05-06 DIAGNOSIS — C8330 Diffuse large B-cell lymphoma, unspecified site: Secondary | ICD-10-CM | POA: Diagnosis not present

## 2018-05-06 DIAGNOSIS — Z510 Encounter for antineoplastic radiation therapy: Secondary | ICD-10-CM | POA: Diagnosis not present

## 2018-05-07 DIAGNOSIS — C8330 Diffuse large B-cell lymphoma, unspecified site: Secondary | ICD-10-CM | POA: Diagnosis not present

## 2018-05-07 DIAGNOSIS — Z510 Encounter for antineoplastic radiation therapy: Secondary | ICD-10-CM | POA: Diagnosis not present

## 2018-05-08 DIAGNOSIS — Z510 Encounter for antineoplastic radiation therapy: Secondary | ICD-10-CM | POA: Diagnosis not present

## 2018-05-08 DIAGNOSIS — R972 Elevated prostate specific antigen [PSA]: Secondary | ICD-10-CM | POA: Diagnosis not present

## 2018-05-08 DIAGNOSIS — N401 Enlarged prostate with lower urinary tract symptoms: Secondary | ICD-10-CM | POA: Diagnosis not present

## 2018-05-08 DIAGNOSIS — C8330 Diffuse large B-cell lymphoma, unspecified site: Secondary | ICD-10-CM | POA: Diagnosis not present

## 2018-05-09 DIAGNOSIS — Z510 Encounter for antineoplastic radiation therapy: Secondary | ICD-10-CM | POA: Diagnosis not present

## 2018-05-09 DIAGNOSIS — C8330 Diffuse large B-cell lymphoma, unspecified site: Secondary | ICD-10-CM | POA: Diagnosis not present

## 2018-05-10 DIAGNOSIS — Z510 Encounter for antineoplastic radiation therapy: Secondary | ICD-10-CM | POA: Diagnosis not present

## 2018-05-10 DIAGNOSIS — C8330 Diffuse large B-cell lymphoma, unspecified site: Secondary | ICD-10-CM | POA: Diagnosis not present

## 2018-05-13 DIAGNOSIS — Z510 Encounter for antineoplastic radiation therapy: Secondary | ICD-10-CM | POA: Diagnosis not present

## 2018-05-13 DIAGNOSIS — C8330 Diffuse large B-cell lymphoma, unspecified site: Secondary | ICD-10-CM | POA: Diagnosis not present

## 2018-05-14 DIAGNOSIS — Z510 Encounter for antineoplastic radiation therapy: Secondary | ICD-10-CM | POA: Diagnosis not present

## 2018-05-14 DIAGNOSIS — R69 Illness, unspecified: Secondary | ICD-10-CM | POA: Diagnosis not present

## 2018-05-14 DIAGNOSIS — C8330 Diffuse large B-cell lymphoma, unspecified site: Secondary | ICD-10-CM | POA: Diagnosis not present

## 2018-05-15 DIAGNOSIS — C8330 Diffuse large B-cell lymphoma, unspecified site: Secondary | ICD-10-CM | POA: Diagnosis not present

## 2018-05-15 DIAGNOSIS — Z510 Encounter for antineoplastic radiation therapy: Secondary | ICD-10-CM | POA: Diagnosis not present

## 2018-05-16 DIAGNOSIS — Z510 Encounter for antineoplastic radiation therapy: Secondary | ICD-10-CM | POA: Diagnosis not present

## 2018-05-16 DIAGNOSIS — C8330 Diffuse large B-cell lymphoma, unspecified site: Secondary | ICD-10-CM | POA: Diagnosis not present

## 2018-05-17 DIAGNOSIS — Z510 Encounter for antineoplastic radiation therapy: Secondary | ICD-10-CM | POA: Diagnosis not present

## 2018-05-17 DIAGNOSIS — C8330 Diffuse large B-cell lymphoma, unspecified site: Secondary | ICD-10-CM | POA: Diagnosis not present

## 2018-05-20 DIAGNOSIS — Z510 Encounter for antineoplastic radiation therapy: Secondary | ICD-10-CM | POA: Diagnosis not present

## 2018-05-20 DIAGNOSIS — C8330 Diffuse large B-cell lymphoma, unspecified site: Secondary | ICD-10-CM | POA: Diagnosis not present

## 2018-05-21 DIAGNOSIS — C8330 Diffuse large B-cell lymphoma, unspecified site: Secondary | ICD-10-CM | POA: Diagnosis not present

## 2018-05-21 DIAGNOSIS — Z510 Encounter for antineoplastic radiation therapy: Secondary | ICD-10-CM | POA: Diagnosis not present

## 2018-05-22 DIAGNOSIS — Z510 Encounter for antineoplastic radiation therapy: Secondary | ICD-10-CM | POA: Diagnosis not present

## 2018-05-22 DIAGNOSIS — C8330 Diffuse large B-cell lymphoma, unspecified site: Secondary | ICD-10-CM | POA: Diagnosis not present

## 2018-05-23 DIAGNOSIS — Z510 Encounter for antineoplastic radiation therapy: Secondary | ICD-10-CM | POA: Diagnosis not present

## 2018-05-23 DIAGNOSIS — C8330 Diffuse large B-cell lymphoma, unspecified site: Secondary | ICD-10-CM | POA: Diagnosis not present

## 2018-05-24 DIAGNOSIS — C8330 Diffuse large B-cell lymphoma, unspecified site: Secondary | ICD-10-CM | POA: Diagnosis not present

## 2018-05-24 DIAGNOSIS — Z510 Encounter for antineoplastic radiation therapy: Secondary | ICD-10-CM | POA: Diagnosis not present

## 2018-05-27 DIAGNOSIS — Z510 Encounter for antineoplastic radiation therapy: Secondary | ICD-10-CM | POA: Diagnosis not present

## 2018-05-27 DIAGNOSIS — C8330 Diffuse large B-cell lymphoma, unspecified site: Secondary | ICD-10-CM | POA: Diagnosis not present

## 2018-05-28 DIAGNOSIS — Z510 Encounter for antineoplastic radiation therapy: Secondary | ICD-10-CM | POA: Diagnosis not present

## 2018-05-28 DIAGNOSIS — C8330 Diffuse large B-cell lymphoma, unspecified site: Secondary | ICD-10-CM | POA: Diagnosis not present

## 2018-06-18 DIAGNOSIS — C8330 Diffuse large B-cell lymphoma, unspecified site: Secondary | ICD-10-CM | POA: Diagnosis not present

## 2018-06-26 DIAGNOSIS — C8330 Diffuse large B-cell lymphoma, unspecified site: Secondary | ICD-10-CM | POA: Diagnosis not present

## 2018-06-26 DIAGNOSIS — Z23 Encounter for immunization: Secondary | ICD-10-CM | POA: Diagnosis not present

## 2018-07-17 DIAGNOSIS — E441 Mild protein-calorie malnutrition: Secondary | ICD-10-CM | POA: Diagnosis not present

## 2018-07-17 DIAGNOSIS — D649 Anemia, unspecified: Secondary | ICD-10-CM | POA: Diagnosis not present

## 2018-07-17 DIAGNOSIS — C8333 Diffuse large B-cell lymphoma, intra-abdominal lymph nodes: Secondary | ICD-10-CM | POA: Diagnosis not present

## 2018-07-17 DIAGNOSIS — C8330 Diffuse large B-cell lymphoma, unspecified site: Secondary | ICD-10-CM | POA: Diagnosis not present

## 2018-07-17 DIAGNOSIS — Z9221 Personal history of antineoplastic chemotherapy: Secondary | ICD-10-CM | POA: Diagnosis not present

## 2018-08-07 DIAGNOSIS — N210 Calculus in bladder: Secondary | ICD-10-CM | POA: Diagnosis not present

## 2018-08-07 DIAGNOSIS — R935 Abnormal findings on diagnostic imaging of other abdominal regions, including retroperitoneum: Secondary | ICD-10-CM | POA: Diagnosis not present

## 2018-08-07 DIAGNOSIS — I700 Atherosclerosis of aorta: Secondary | ICD-10-CM | POA: Diagnosis not present

## 2018-08-07 DIAGNOSIS — C8590 Non-Hodgkin lymphoma, unspecified, unspecified site: Secondary | ICD-10-CM | POA: Diagnosis not present

## 2018-08-07 DIAGNOSIS — C8330 Diffuse large B-cell lymphoma, unspecified site: Secondary | ICD-10-CM | POA: Diagnosis not present

## 2018-09-17 DIAGNOSIS — I10 Essential (primary) hypertension: Secondary | ICD-10-CM | POA: Diagnosis not present

## 2018-09-17 DIAGNOSIS — Z6823 Body mass index (BMI) 23.0-23.9, adult: Secondary | ICD-10-CM | POA: Diagnosis not present

## 2018-09-17 DIAGNOSIS — Z79899 Other long term (current) drug therapy: Secondary | ICD-10-CM | POA: Diagnosis not present

## 2018-09-17 DIAGNOSIS — C8330 Diffuse large B-cell lymphoma, unspecified site: Secondary | ICD-10-CM | POA: Diagnosis not present

## 2018-09-17 DIAGNOSIS — E785 Hyperlipidemia, unspecified: Secondary | ICD-10-CM | POA: Diagnosis not present

## 2018-09-17 DIAGNOSIS — K219 Gastro-esophageal reflux disease without esophagitis: Secondary | ICD-10-CM | POA: Diagnosis not present

## 2018-09-17 DIAGNOSIS — E039 Hypothyroidism, unspecified: Secondary | ICD-10-CM | POA: Diagnosis not present

## 2018-09-17 DIAGNOSIS — R05 Cough: Secondary | ICD-10-CM | POA: Diagnosis not present

## 2018-09-17 DIAGNOSIS — M159 Polyosteoarthritis, unspecified: Secondary | ICD-10-CM | POA: Diagnosis not present

## 2018-10-02 DIAGNOSIS — R05 Cough: Secondary | ICD-10-CM | POA: Diagnosis not present

## 2018-10-02 DIAGNOSIS — C8330 Diffuse large B-cell lymphoma, unspecified site: Secondary | ICD-10-CM | POA: Diagnosis not present

## 2018-10-02 DIAGNOSIS — D649 Anemia, unspecified: Secondary | ICD-10-CM | POA: Diagnosis not present

## 2018-10-02 DIAGNOSIS — D708 Other neutropenia: Secondary | ICD-10-CM | POA: Diagnosis not present

## 2018-11-07 DIAGNOSIS — R972 Elevated prostate specific antigen [PSA]: Secondary | ICD-10-CM | POA: Diagnosis not present

## 2018-11-07 DIAGNOSIS — N401 Enlarged prostate with lower urinary tract symptoms: Secondary | ICD-10-CM | POA: Diagnosis not present

## 2018-12-11 DIAGNOSIS — N200 Calculus of kidney: Secondary | ICD-10-CM | POA: Diagnosis not present

## 2018-12-11 DIAGNOSIS — C8330 Diffuse large B-cell lymphoma, unspecified site: Secondary | ICD-10-CM | POA: Diagnosis not present

## 2018-12-12 DIAGNOSIS — C8330 Diffuse large B-cell lymphoma, unspecified site: Secondary | ICD-10-CM | POA: Diagnosis not present

## 2019-01-23 DIAGNOSIS — H52223 Regular astigmatism, bilateral: Secondary | ICD-10-CM | POA: Diagnosis not present

## 2019-03-26 DIAGNOSIS — C8330 Diffuse large B-cell lymphoma, unspecified site: Secondary | ICD-10-CM | POA: Diagnosis not present

## 2019-03-26 DIAGNOSIS — R972 Elevated prostate specific antigen [PSA]: Secondary | ICD-10-CM | POA: Diagnosis not present

## 2019-03-26 DIAGNOSIS — M159 Polyosteoarthritis, unspecified: Secondary | ICD-10-CM | POA: Diagnosis not present

## 2019-03-26 DIAGNOSIS — Z6824 Body mass index (BMI) 24.0-24.9, adult: Secondary | ICD-10-CM | POA: Diagnosis not present

## 2019-03-26 DIAGNOSIS — Z0000 Encounter for general adult medical examination without abnormal findings: Secondary | ICD-10-CM | POA: Diagnosis not present

## 2019-03-26 DIAGNOSIS — N400 Enlarged prostate without lower urinary tract symptoms: Secondary | ICD-10-CM | POA: Diagnosis not present

## 2019-03-26 DIAGNOSIS — Z79899 Other long term (current) drug therapy: Secondary | ICD-10-CM | POA: Diagnosis not present

## 2019-03-26 DIAGNOSIS — E039 Hypothyroidism, unspecified: Secondary | ICD-10-CM | POA: Diagnosis not present

## 2019-03-26 DIAGNOSIS — I10 Essential (primary) hypertension: Secondary | ICD-10-CM | POA: Diagnosis not present

## 2019-03-26 DIAGNOSIS — E785 Hyperlipidemia, unspecified: Secondary | ICD-10-CM | POA: Diagnosis not present

## 2019-04-16 DIAGNOSIS — Z8572 Personal history of non-Hodgkin lymphomas: Secondary | ICD-10-CM | POA: Diagnosis not present

## 2019-04-16 DIAGNOSIS — R935 Abnormal findings on diagnostic imaging of other abdominal regions, including retroperitoneum: Secondary | ICD-10-CM | POA: Diagnosis not present

## 2019-04-16 DIAGNOSIS — N210 Calculus in bladder: Secondary | ICD-10-CM | POA: Diagnosis not present

## 2019-04-16 DIAGNOSIS — K760 Fatty (change of) liver, not elsewhere classified: Secondary | ICD-10-CM | POA: Diagnosis not present

## 2019-04-16 DIAGNOSIS — C8338 Diffuse large B-cell lymphoma, lymph nodes of multiple sites: Secondary | ICD-10-CM | POA: Diagnosis not present

## 2019-04-18 DIAGNOSIS — C8330 Diffuse large B-cell lymphoma, unspecified site: Secondary | ICD-10-CM | POA: Diagnosis not present

## 2019-05-08 DIAGNOSIS — Z77122 Contact with and (suspected) exposure to noise: Secondary | ICD-10-CM | POA: Diagnosis not present

## 2019-05-08 DIAGNOSIS — H903 Sensorineural hearing loss, bilateral: Secondary | ICD-10-CM | POA: Diagnosis not present

## 2019-05-08 DIAGNOSIS — H93299 Other abnormal auditory perceptions, unspecified ear: Secondary | ICD-10-CM | POA: Diagnosis not present

## 2019-05-08 DIAGNOSIS — H9319 Tinnitus, unspecified ear: Secondary | ICD-10-CM | POA: Diagnosis not present

## 2019-05-08 DIAGNOSIS — H9193 Unspecified hearing loss, bilateral: Secondary | ICD-10-CM | POA: Diagnosis not present

## 2019-05-13 DIAGNOSIS — N401 Enlarged prostate with lower urinary tract symptoms: Secondary | ICD-10-CM | POA: Diagnosis not present

## 2019-05-13 DIAGNOSIS — R972 Elevated prostate specific antigen [PSA]: Secondary | ICD-10-CM | POA: Diagnosis not present

## 2019-05-22 DIAGNOSIS — H903 Sensorineural hearing loss, bilateral: Secondary | ICD-10-CM | POA: Diagnosis not present

## 2019-05-22 DIAGNOSIS — H93299 Other abnormal auditory perceptions, unspecified ear: Secondary | ICD-10-CM | POA: Diagnosis not present

## 2019-05-22 DIAGNOSIS — H9319 Tinnitus, unspecified ear: Secondary | ICD-10-CM | POA: Diagnosis not present

## 2019-07-03 DIAGNOSIS — R69 Illness, unspecified: Secondary | ICD-10-CM | POA: Diagnosis not present

## 2019-07-21 DIAGNOSIS — H93299 Other abnormal auditory perceptions, unspecified ear: Secondary | ICD-10-CM | POA: Diagnosis not present

## 2019-07-21 DIAGNOSIS — H903 Sensorineural hearing loss, bilateral: Secondary | ICD-10-CM | POA: Diagnosis not present

## 2019-10-13 DIAGNOSIS — C8590 Non-Hodgkin lymphoma, unspecified, unspecified site: Secondary | ICD-10-CM | POA: Diagnosis not present

## 2019-10-13 DIAGNOSIS — N210 Calculus in bladder: Secondary | ICD-10-CM | POA: Diagnosis not present

## 2019-10-13 DIAGNOSIS — I700 Atherosclerosis of aorta: Secondary | ICD-10-CM | POA: Diagnosis not present

## 2019-10-13 DIAGNOSIS — C8330 Diffuse large B-cell lymphoma, unspecified site: Secondary | ICD-10-CM | POA: Diagnosis not present

## 2019-10-15 DIAGNOSIS — C8330 Diffuse large B-cell lymphoma, unspecified site: Secondary | ICD-10-CM | POA: Diagnosis not present

## 2019-11-12 DIAGNOSIS — R972 Elevated prostate specific antigen [PSA]: Secondary | ICD-10-CM | POA: Diagnosis not present

## 2019-11-12 DIAGNOSIS — N401 Enlarged prostate with lower urinary tract symptoms: Secondary | ICD-10-CM | POA: Diagnosis not present

## 2020-03-19 DIAGNOSIS — K219 Gastro-esophageal reflux disease without esophagitis: Secondary | ICD-10-CM | POA: Diagnosis not present

## 2020-03-19 DIAGNOSIS — R195 Other fecal abnormalities: Secondary | ICD-10-CM | POA: Diagnosis not present

## 2020-03-20 DIAGNOSIS — K529 Noninfective gastroenteritis and colitis, unspecified: Secondary | ICD-10-CM | POA: Diagnosis not present

## 2020-03-26 DIAGNOSIS — I10 Essential (primary) hypertension: Secondary | ICD-10-CM | POA: Diagnosis not present

## 2020-03-26 DIAGNOSIS — M159 Polyosteoarthritis, unspecified: Secondary | ICD-10-CM | POA: Diagnosis not present

## 2020-03-26 DIAGNOSIS — Z0000 Encounter for general adult medical examination without abnormal findings: Secondary | ICD-10-CM | POA: Diagnosis not present

## 2020-03-26 DIAGNOSIS — N400 Enlarged prostate without lower urinary tract symptoms: Secondary | ICD-10-CM | POA: Diagnosis not present

## 2020-03-26 DIAGNOSIS — R739 Hyperglycemia, unspecified: Secondary | ICD-10-CM | POA: Diagnosis not present

## 2020-03-26 DIAGNOSIS — C8330 Diffuse large B-cell lymphoma, unspecified site: Secondary | ICD-10-CM | POA: Diagnosis not present

## 2020-03-26 DIAGNOSIS — E785 Hyperlipidemia, unspecified: Secondary | ICD-10-CM | POA: Diagnosis not present

## 2020-03-26 DIAGNOSIS — K219 Gastro-esophageal reflux disease without esophagitis: Secondary | ICD-10-CM | POA: Diagnosis not present

## 2020-03-26 DIAGNOSIS — E039 Hypothyroidism, unspecified: Secondary | ICD-10-CM | POA: Diagnosis not present

## 2020-03-26 DIAGNOSIS — Z79899 Other long term (current) drug therapy: Secondary | ICD-10-CM | POA: Diagnosis not present

## 2020-04-07 DIAGNOSIS — H5203 Hypermetropia, bilateral: Secondary | ICD-10-CM | POA: Diagnosis not present

## 2020-05-17 DIAGNOSIS — R972 Elevated prostate specific antigen [PSA]: Secondary | ICD-10-CM | POA: Diagnosis not present

## 2020-05-17 DIAGNOSIS — N401 Enlarged prostate with lower urinary tract symptoms: Secondary | ICD-10-CM | POA: Diagnosis not present

## 2020-06-15 DIAGNOSIS — I2510 Atherosclerotic heart disease of native coronary artery without angina pectoris: Secondary | ICD-10-CM | POA: Diagnosis not present

## 2020-06-15 DIAGNOSIS — N210 Calculus in bladder: Secondary | ICD-10-CM | POA: Diagnosis not present

## 2020-06-15 DIAGNOSIS — C8330 Diffuse large B-cell lymphoma, unspecified site: Secondary | ICD-10-CM | POA: Diagnosis not present

## 2020-06-15 DIAGNOSIS — N209 Urinary calculus, unspecified: Secondary | ICD-10-CM | POA: Diagnosis not present

## 2020-06-15 DIAGNOSIS — I700 Atherosclerosis of aorta: Secondary | ICD-10-CM | POA: Diagnosis not present

## 2020-06-15 DIAGNOSIS — I708 Atherosclerosis of other arteries: Secondary | ICD-10-CM | POA: Diagnosis not present

## 2020-06-16 ENCOUNTER — Other Ambulatory Visit: Payer: Self-pay | Admitting: Pharmacist

## 2020-06-16 DIAGNOSIS — C8330 Diffuse large B-cell lymphoma, unspecified site: Secondary | ICD-10-CM | POA: Diagnosis not present

## 2020-06-16 DIAGNOSIS — Z23 Encounter for immunization: Secondary | ICD-10-CM

## 2020-06-17 ENCOUNTER — Inpatient Hospital Stay: Payer: Medicare HMO | Attending: Oncology

## 2020-06-17 ENCOUNTER — Other Ambulatory Visit: Payer: Self-pay

## 2020-06-17 DIAGNOSIS — Z23 Encounter for immunization: Secondary | ICD-10-CM | POA: Diagnosis not present

## 2020-06-17 MED ORDER — INFLUENZA VAC SPLIT QUAD 0.5 ML IM SUSY
PREFILLED_SYRINGE | INTRAMUSCULAR | Status: AC
  Filled 2020-06-17: qty 0.5

## 2020-06-17 MED ORDER — INFLUENZA VAC SPLIT QUAD 0.5 ML IM SUSY
0.5000 mL | PREFILLED_SYRINGE | Freq: Once | INTRAMUSCULAR | Status: AC
  Administered 2020-06-17: 0.5 mL via INTRAMUSCULAR

## 2020-06-17 NOTE — Patient Instructions (Signed)
Influenza Virus Vaccine injection What is this medicine? INFLUENZA VIRUS VACCINE (in floo EN zuh VAHY ruhs vak SEEN) helps to reduce the risk of getting influenza also known as the flu. The vaccine only helps protect you against some strains of the flu. This medicine may be used for other purposes; ask your health care provider or pharmacist if you have questions. COMMON BRAND NAME(S): Afluria, Afluria Quadrivalent, Agriflu, Alfuria, FLUAD, Fluarix, Fluarix Quadrivalent, Flublok, Flublok Quadrivalent, FLUCELVAX, FLUCELVAX Quadrivalent, Flulaval, Flulaval Quadrivalent, Fluvirin, Fluzone, Fluzone High-Dose, Fluzone Intradermal, Fluzone Quadrivalent What should I tell my health care provider before I take this medicine? They need to know if you have any of these conditions:  bleeding disorder like hemophilia  fever or infection  Guillain-Barre syndrome or other neurological problems  immune system problems  infection with the human immunodeficiency virus (HIV) or AIDS  low blood platelet counts  multiple sclerosis  an unusual or allergic reaction to influenza virus vaccine, latex, other medicines, foods, dyes, or preservatives. Different brands of vaccines contain different allergens. Some may contain latex or eggs. Talk to your doctor about your allergies to make sure that you get the right vaccine.  pregnant or trying to get pregnant  breast-feeding How should I use this medicine? This vaccine is for injection into a muscle or under the skin. It is given by a health care professional. A copy of Vaccine Information Statements will be given before each vaccination. Read this sheet carefully each time. The sheet may change frequently. Talk to your healthcare provider to see which vaccines are right for you. Some vaccines should not be used in all age groups. Overdosage: If you think you have taken too much of this medicine contact a poison control center or emergency room at once. NOTE:  This medicine is only for you. Do not share this medicine with others. What if I miss a dose? This does not apply. What may interact with this medicine?  chemotherapy or radiation therapy  medicines that lower your immune system like etanercept, anakinra, infliximab, and adalimumab  medicines that treat or prevent blood clots like warfarin  phenytoin  steroid medicines like prednisone or cortisone  theophylline  vaccines This list may not describe all possible interactions. Give your health care provider a list of all the medicines, herbs, non-prescription drugs, or dietary supplements you use. Also tell them if you smoke, drink alcohol, or use illegal drugs. Some items may interact with your medicine. What should I watch for while using this medicine? Report any side effects that do not go away within 3 days to your doctor or health care professional. Call your health care provider if any unusual symptoms occur within 6 weeks of receiving this vaccine. You may still catch the flu, but the illness is not usually as bad. You cannot get the flu from the vaccine. The vaccine will not protect against colds or other illnesses that may cause fever. The vaccine is needed every year. What side effects may I notice from receiving this medicine? Side effects that you should report to your doctor or health care professional as soon as possible:  allergic reactions like skin rash, itching or hives, swelling of the face, lips, or tongue Side effects that usually do not require medical attention (report to your doctor or health care professional if they continue or are bothersome):  fever  headache  muscle aches and pains  pain, tenderness, redness, or swelling at the injection site  tiredness This list may not describe  all possible side effects. Call your doctor for medical advice about side effects. You may report side effects to FDA at 1-800-FDA-1088. Where should I keep my medicine? The  vaccine will be given by a health care professional in a clinic, pharmacy, doctor's office, or other health care setting. You will not be given vaccine doses to store at home. NOTE: This sheet is a summary. It may not cover all possible information. If you have questions about this medicine, talk to your doctor, pharmacist, or health care provider.  2020 Elsevier/Gold Standard (2018-07-09 08:45:43)

## 2020-06-17 NOTE — Progress Notes (Signed)
PT IS STABLE AT DISCHARGE. 

## 2020-11-15 DIAGNOSIS — N401 Enlarged prostate with lower urinary tract symptoms: Secondary | ICD-10-CM | POA: Diagnosis not present

## 2020-11-15 DIAGNOSIS — R972 Elevated prostate specific antigen [PSA]: Secondary | ICD-10-CM | POA: Diagnosis not present

## 2020-11-23 DIAGNOSIS — L821 Other seborrheic keratosis: Secondary | ICD-10-CM | POA: Diagnosis not present

## 2020-12-02 DIAGNOSIS — L821 Other seborrheic keratosis: Secondary | ICD-10-CM | POA: Diagnosis not present

## 2020-12-02 DIAGNOSIS — C44519 Basal cell carcinoma of skin of other part of trunk: Secondary | ICD-10-CM | POA: Diagnosis not present

## 2020-12-15 ENCOUNTER — Telehealth: Payer: Self-pay | Admitting: Hematology and Oncology

## 2020-12-15 ENCOUNTER — Inpatient Hospital Stay: Payer: Medicare HMO | Admitting: Hematology and Oncology

## 2020-12-15 ENCOUNTER — Inpatient Hospital Stay: Payer: Medicare HMO | Attending: Hematology and Oncology

## 2020-12-15 ENCOUNTER — Other Ambulatory Visit: Payer: Self-pay | Admitting: Hematology and Oncology

## 2020-12-15 ENCOUNTER — Encounter: Payer: Self-pay | Admitting: Hematology and Oncology

## 2020-12-15 ENCOUNTER — Other Ambulatory Visit: Payer: Self-pay

## 2020-12-15 VITALS — BP 168/81 | HR 66 | Temp 98.1°F | Resp 18 | Ht 71.0 in | Wt 179.6 lb

## 2020-12-15 DIAGNOSIS — Z9221 Personal history of antineoplastic chemotherapy: Secondary | ICD-10-CM | POA: Insufficient documentation

## 2020-12-15 DIAGNOSIS — Z791 Long term (current) use of non-steroidal anti-inflammatories (NSAID): Secondary | ICD-10-CM | POA: Insufficient documentation

## 2020-12-15 DIAGNOSIS — C8333 Diffuse large B-cell lymphoma, intra-abdominal lymph nodes: Secondary | ICD-10-CM

## 2020-12-15 DIAGNOSIS — Z79899 Other long term (current) drug therapy: Secondary | ICD-10-CM | POA: Insufficient documentation

## 2020-12-15 DIAGNOSIS — Z923 Personal history of irradiation: Secondary | ICD-10-CM | POA: Insufficient documentation

## 2020-12-15 DIAGNOSIS — C8330 Diffuse large B-cell lymphoma, unspecified site: Secondary | ICD-10-CM | POA: Diagnosis not present

## 2020-12-15 DIAGNOSIS — D649 Anemia, unspecified: Secondary | ICD-10-CM | POA: Diagnosis not present

## 2020-12-15 LAB — BASIC METABOLIC PANEL
BUN: 10 (ref 4–21)
CO2: 29 — AB (ref 13–22)
Chloride: 104 (ref 99–108)
Creatinine: 0.8 (ref 0.6–1.3)
Glucose: 94
Potassium: 4 (ref 3.4–5.3)
Sodium: 140 (ref 137–147)

## 2020-12-15 LAB — CBC
MCV: 96 — AB (ref 80–94)
MCV: 96 — AB (ref 80–94)
RBC: 4.29 (ref 3.87–5.11)
RBC: 4.29 (ref 3.87–5.11)

## 2020-12-15 LAB — CBC AND DIFFERENTIAL
HCT: 41 (ref 41–53)
HCT: 41 (ref 41–53)
Hemoglobin: 13.8 (ref 13.5–17.5)
Hemoglobin: 13.8 (ref 13.5–17.5)
Neutrophils Absolute: 2.65
Neutrophils Absolute: 2.65
Platelets: 216 (ref 150–399)
Platelets: 216 (ref 150–399)
WBC: 5.4
WBC: 5.4

## 2020-12-15 LAB — HEPATIC FUNCTION PANEL
ALT: 27 (ref 10–40)
AST: 29 (ref 14–40)
Alkaline Phosphatase: 69 (ref 25–125)
Bilirubin, Total: 0.6

## 2020-12-15 LAB — COMPREHENSIVE METABOLIC PANEL
Albumin: 4.3 (ref 3.5–5.0)
Calcium: 9.2 (ref 8.7–10.7)

## 2020-12-15 LAB — LACTATE DEHYDROGENASE: LDH: 132 U/L (ref 98–192)

## 2020-12-15 NOTE — Progress Notes (Signed)
[place]  [address] [place],  [place]  [address] [phone]  Clinic Day:  12/15/2020  Referring physician: Emmaline Kluver, *   CHIEF COMPLAINT:  CC:   Stage IIA diffuse large B-cell lymphoma  Current Treatment:   Observation    HISTORY OF PRESENT ILLNESS:  Thomas Lucas is a 70 y.o. 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  He underwent open repair with mesh placement and was found to have an incidental mass involving the distal hernia sac and vas deferens.  Numerous stains were negative but this did stain positive for leukocyte common antigen and CD 45.  Additional stains showed positivity for CD 20, CD 10, BCL 6, BCL 2, CD 21, and CD 30.  This is felt to be can consistent with a diffuse large B-cell lymphoma of germinal center origin, high-grade.  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  CT chest, abdomen, and pelvis revealed a large nodal mass within the mesentery measuring 10.8 x 7.1 by 13.5 cm and a portacaval node measuring 2.8 cm, as well as an abnormal soft tissue infiltration within the retrocrural and retroperitoneal region of the abdomen and encasing the abdominal aorta measuring 3.3 cm x 4.6 cm and is 9.4 cm in length.  There were also multiple stones in the dependent portion of the bladder.  He was found to have iron deficiency anemia and was treated with IV iron in the form of Feraheme in March 2019.  He received 6 cycles of R-CHOP chemotherapy completed in July 2019 with good response.  He tolerated treatment fairly well but did have worsening anemia, neuropathy of the toes, lower extremity edema and hypokalemia.  He was given furosemide 20 mg daily as needed for the lower extremity edema and was placed on potassium chloride 20 mEq daily.  Furosemide and potassium were subsequently  discontinued.  He also had an episode of acute cholecystitis and required cholecystectomy at the end of July.  He had residual disease in the abdomen on CT imaging in July, so received consolidation radiation to the mesenteric mass completed in October 2019.  PET scan in December revealed a persistent mesenteric mass with a lower SUV than previously.    CT abdomen and pelvis in April 2020 revealed a persistent, but stable soft tissue density in the central small bowel mesentery encasing the mesenteric vessels and measuring over 7 cm in maximum diameter.  There was no evidence of new or progressive disease and no other adenopathy.  There were multiple bladder calculi. CT chest, abdomen and pelvis in August revealed a slight decrease in the small bowel mesenteric nodal mass now measuring 7.3 by 2.4 cm with a stable adjacent soft tissue lesion measuring 2.1 cm.  There was no evidence of new or progressive disease. There were persistent bladder calculi.  There were changes in the liver consistent with hepatic steatosis.  CT chest, abdomen and pelvis in February 2021 revealed continued decrease in the central mesenteric soft tissue mass measuring 6.9 by 1.7 cm.  CT chest, abdomen and pelvis in October 2021 revealed stable matted soft tissue density in the small bowel mesentery consistent with treated lymphoma.  No new or progressive findings were seen.    INTERVAL HISTORY:  Thomas Lucas is here today for repeat clinical assessment. He denies fevers, chills or night  sweats. He denies significant fatigue.  He denies palpable adenopathy. He denies pain. His appetite is good. His weight has been stable.  He states he is having his Port-A-Cath removed soon.  REVIEW OF SYSTEMS:  Review of Systems  Constitutional: Negative for appetite change, chills, fatigue, fever and unexpected weight change.  HENT:   Negative for lump/mass, mouth sores and sore throat.   Respiratory: Negative for cough and shortness of breath.    Cardiovascular: Negative for chest pain and leg swelling.  Gastrointestinal: Negative for abdominal pain, constipation, diarrhea, nausea and vomiting.  Genitourinary: Negative for difficulty urinating, dysuria, frequency and hematuria.   Musculoskeletal: Negative for arthralgias, back pain and myalgias.  Skin: Negative for itching, rash and wound.  Neurological: Negative for dizziness, extremity weakness, headaches, light-headedness and numbness.  Hematological: Negative for adenopathy.  Psychiatric/Behavioral: Negative for depression and sleep disturbance. The patient is not nervous/anxious.     VITALS:  Blood pressure (!) 168/81, pulse 66, temperature 98.1 F (36.7 C), temperature source Oral, resp. rate 18, height 5\' 11"  (1.803 m), weight 179 lb 9.6 oz (81.5 kg), SpO2 96 %.  Wt Readings from Last 3 Encounters:  12/15/20 179 lb 9.6 oz (81.5 kg)    Body mass index is 25.05 kg/m.  Performance status (ECOG): 0 - Asymptomatic  PHYSICAL EXAM:  Physical Exam Vitals and nursing note reviewed.  Constitutional:      General: He is not in acute distress.    Appearance: Normal appearance. He is normal weight.  HENT:     Head: Normocephalic and atraumatic.     Mouth/Throat:     Mouth: Mucous membranes are moist.     Pharynx: Oropharynx is clear. No oropharyngeal exudate or posterior oropharyngeal erythema.  Eyes:     General: No scleral icterus.    Extraocular Movements: Extraocular movements intact.     Conjunctiva/sclera: Conjunctivae normal.     Pupils: Pupils are equal, round, and reactive to light.  Cardiovascular:     Rate and Rhythm: Normal rate and regular rhythm.     Heart sounds: Normal heart sounds. No murmur heard. No friction rub. No gallop.   Pulmonary:     Effort: Pulmonary effort is normal.     Breath sounds: Normal breath sounds. No wheezing, rhonchi or rales.  Abdominal:     General: Bowel sounds are normal. There is no distension.     Palpations: Abdomen is  soft. There is no mass.     Tenderness: There is no abdominal tenderness.  Musculoskeletal:        General: Normal range of motion.     Cervical back: Normal range of motion and neck supple. No tenderness.     Right lower leg: No edema.     Left lower leg: No edema.  Lymphadenopathy:     Cervical: No cervical adenopathy.  Skin:    General: Skin is warm and dry.     Coloration: Skin is not jaundiced.     Findings: No rash.  Neurological:     Mental Status: He is alert and oriented to person, place, and time.     Cranial Nerves: No cranial nerve deficit.  Psychiatric:        Mood and Affect: Mood normal.        Behavior: Behavior normal.        Thought Content: Thought content normal.    LABS:   CBC Latest Ref Rng & Units 12/15/2020 12/15/2020 02/16/2015  WBC - 5.4 5.4 -  Hemoglobin 13.5 - 17.5 13.8 13.8 14.6  Hematocrit 41 - 53 41 41 43.0  Platelets 150 - 399 216 216 -   CMP Latest Ref Rng & Units 12/15/2020 02/16/2015  Glucose 65 - 99 mg/dL - 89  BUN 4 - 21 10 11   Creatinine 0.6 - 1.3 0.8 0.90  Sodium 137 - 147 140 140  Potassium 3.4 - 5.3 4.0 4.4  Chloride 99 - 108 104 102  CO2 13 - 22 29(A) -  Calcium 8.7 - 10.7 9.2 -  Alkaline Phos 25 - 125 69 -  AST 14 - 40 29 -  ALT 10 - 40 27 -     No results found for: CEA1 / No results found for: CEA1 No results found for: PSA1 No results found for: SWF093 No results found for: ATF573  No results found for: TOTALPROTELP, ALBUMINELP, A1GS, A2GS, BETS, BETA2SER, GAMS, MSPIKE, SPEI No results found for: TIBC, FERRITIN, IRONPCTSAT No results found for: LDH  STUDIES:  No results found.    HISTORY:  History reviewed. No pertinent past medical history.  History reviewed. No pertinent surgical history.  History reviewed. No pertinent family history.  Social History:  reports that he has never smoked. He has never used smokeless tobacco. He reports that he does not drink alcohol and does not use drugs.The patient is alone  today.  Allergies: No Known Allergies  Current Medications: Current Outpatient Medications  Medication Sig Dispense Refill  . diclofenac (VOLTAREN) 75 MG EC tablet Take 75 mg by mouth 2 (two) times daily as needed.    . finasteride (PROSCAR) 5 MG tablet Take 5 mg by mouth daily.    Marland Kitchen GUAR GUM PO Take by mouth daily.    Marland Kitchen levothyroxine (SYNTHROID) 25 MCG tablet Take 25 mcg by mouth at bedtime.    . Multiple Vitamin (MULTIVITAMIN WITH MINERALS) TABS tablet Take 1 tablet by mouth daily. Multivitamin with minerals/herbal complex 2 tabs daily    . Omeprazole Magnesium 20.6 (20 Base) MG CPDR Take by mouth.    . simvastatin (ZOCOR) 20 MG tablet Take 20 mg by mouth at bedtime.     No current facility-administered medications for this visit.     ASSESSMENT & PLAN:   Assessment/Plan:  Thomas Lucas is a 70 y.o. male with a history of stage IIA diffuse large B-cell lymphoma status post R-CHOP chemotherapy and consolidative radiation therapy. He remains without evidence of progression. There was a stable mesenteric soft tissue mass and adjacent soft tissue lesion without evidence of progression on the most recent CT imaging.  We will plan to see him back in 6 months with a CBC, comprehensive metabolic panel, LDH and CT chest, abdomen and pelvis. The patient understands the plans discussed today and is in agreement with them.  He knows to contact our office if he develops concerns prior to his next appointment.    Marvia Pickles, PA-C

## 2020-12-15 NOTE — Telephone Encounter (Signed)
Per 4/20 LOS, patient scheduled for Oct Follow Up - will order Labs, CT Scan in Sept after receiving Pre Authorization foar CT Scan.  Gave patient Appt Summary

## 2021-03-21 DIAGNOSIS — Z800 Family history of malignant neoplasm of digestive organs: Secondary | ICD-10-CM | POA: Diagnosis not present

## 2021-03-21 DIAGNOSIS — Z1211 Encounter for screening for malignant neoplasm of colon: Secondary | ICD-10-CM | POA: Diagnosis not present

## 2021-03-21 DIAGNOSIS — Z86010 Personal history of colonic polyps: Secondary | ICD-10-CM | POA: Diagnosis not present

## 2021-03-31 DIAGNOSIS — N400 Enlarged prostate without lower urinary tract symptoms: Secondary | ICD-10-CM | POA: Diagnosis not present

## 2021-03-31 DIAGNOSIS — Z0000 Encounter for general adult medical examination without abnormal findings: Secondary | ICD-10-CM | POA: Diagnosis not present

## 2021-03-31 DIAGNOSIS — I10 Essential (primary) hypertension: Secondary | ICD-10-CM | POA: Diagnosis not present

## 2021-03-31 DIAGNOSIS — E039 Hypothyroidism, unspecified: Secondary | ICD-10-CM | POA: Diagnosis not present

## 2021-03-31 DIAGNOSIS — C8330 Diffuse large B-cell lymphoma, unspecified site: Secondary | ICD-10-CM | POA: Diagnosis not present

## 2021-03-31 DIAGNOSIS — R972 Elevated prostate specific antigen [PSA]: Secondary | ICD-10-CM | POA: Diagnosis not present

## 2021-03-31 DIAGNOSIS — Z6823 Body mass index (BMI) 23.0-23.9, adult: Secondary | ICD-10-CM | POA: Diagnosis not present

## 2021-03-31 DIAGNOSIS — M1991 Primary osteoarthritis, unspecified site: Secondary | ICD-10-CM | POA: Diagnosis not present

## 2021-03-31 DIAGNOSIS — E785 Hyperlipidemia, unspecified: Secondary | ICD-10-CM | POA: Diagnosis not present

## 2021-03-31 DIAGNOSIS — K2960 Other gastritis without bleeding: Secondary | ICD-10-CM | POA: Diagnosis not present

## 2021-04-11 DIAGNOSIS — H52221 Regular astigmatism, right eye: Secondary | ICD-10-CM | POA: Diagnosis not present

## 2021-05-24 DIAGNOSIS — N401 Enlarged prostate with lower urinary tract symptoms: Secondary | ICD-10-CM | POA: Diagnosis not present

## 2021-05-24 DIAGNOSIS — R972 Elevated prostate specific antigen [PSA]: Secondary | ICD-10-CM | POA: Diagnosis not present

## 2021-06-16 ENCOUNTER — Ambulatory Visit: Payer: Medicare HMO | Admitting: Oncology

## 2021-06-22 ENCOUNTER — Encounter: Payer: Self-pay | Admitting: Oncology

## 2021-06-22 DIAGNOSIS — C8333 Diffuse large B-cell lymphoma, intra-abdominal lymph nodes: Secondary | ICD-10-CM | POA: Diagnosis not present

## 2021-06-22 DIAGNOSIS — I700 Atherosclerosis of aorta: Secondary | ICD-10-CM | POA: Diagnosis not present

## 2021-06-22 DIAGNOSIS — N210 Calculus in bladder: Secondary | ICD-10-CM | POA: Diagnosis not present

## 2021-06-22 DIAGNOSIS — Z9049 Acquired absence of other specified parts of digestive tract: Secondary | ICD-10-CM | POA: Diagnosis not present

## 2021-06-22 LAB — HEPATIC FUNCTION PANEL
ALT: 28 (ref 10–40)
AST: 37 (ref 14–40)
Alkaline Phosphatase: 66 (ref 25–125)
Bilirubin, Total: 0.4

## 2021-06-22 LAB — CBC AND DIFFERENTIAL
HCT: 41 (ref 41–53)
Hemoglobin: 13.9 (ref 13.5–17.5)
Neutrophils Absolute: 2.13
Platelets: 204 (ref 150–399)
WBC: 5.2

## 2021-06-22 LAB — BASIC METABOLIC PANEL
BUN: 11 (ref 4–21)
CO2: 32 — AB (ref 13–22)
Chloride: 104 (ref 99–108)
Creatinine: 0.9 (ref 0.6–1.3)
Glucose: 98
Potassium: 3.9 (ref 3.4–5.3)
Sodium: 141 (ref 137–147)

## 2021-06-22 LAB — CBC: RBC: 4.33 (ref 3.87–5.11)

## 2021-06-22 LAB — COMPREHENSIVE METABOLIC PANEL
Albumin: 4.2 (ref 3.5–5.0)
Calcium: 8.8 (ref 8.7–10.7)

## 2021-06-24 ENCOUNTER — Inpatient Hospital Stay: Payer: Medicare HMO | Attending: Oncology | Admitting: Hematology and Oncology

## 2021-06-24 ENCOUNTER — Telehealth: Payer: Self-pay | Admitting: Hematology and Oncology

## 2021-06-24 VITALS — BP 142/78 | HR 65 | Temp 97.8°F | Resp 20 | Ht 71.0 in | Wt 179.7 lb

## 2021-06-24 DIAGNOSIS — C8333 Diffuse large B-cell lymphoma, intra-abdominal lymph nodes: Secondary | ICD-10-CM | POA: Diagnosis not present

## 2021-06-24 LAB — CBC: MCV: 95 — AB (ref 80–94)

## 2021-06-24 LAB — LACTATE DEHYDROGENASE: LDH: 398 (ref 313–618)

## 2021-06-24 NOTE — Progress Notes (Signed)
Thomas Lucas  [address] [place],  Thomas Lucas  [id number] [phone]  Clinic Day:  06/24/2021  Referring physician: Venetia Maxon, Sharon Mt, *   CHIEF COMPLAINT:  CC:   Stage IIA diffuse large B-cell lymphoma  Current Treatment:   Observation    HISTORY OF PRESENT ILLNESS:  Thomas Lucas is a 70 y.o. 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  He underwent open repair with mesh placement and was found to have an incidental mass involving the distal hernia sac and vas deferens.  Numerous stains were negative but this did stain positive for leukocyte common antigen and CD 45.  Additional stains showed positivity for CD 20, CD 10, BCL 6, BCL 2, CD 21, and CD 30.  This is felt to be can consistent with a diffuse large B-cell lymphoma of germinal center origin, high-grade.  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  CT chest, abdomen, and pelvis revealed a large nodal mass within the mesentery measuring 10.8 x 7.1 by 13.5 cm and a portacaval node measuring 2.8 cm, as well as an abnormal soft tissue infiltration within the retrocrural and retroperitoneal region of the abdomen and encasing the abdominal aorta measuring 3.3 cm x 4.6 cm and is 9.4 cm in length.  There were also multiple stones in the dependent portion of the bladder.  He was found to have iron deficiency anemia and was treated with IV iron in the form of Feraheme in March 2019.  He received 6 cycles of R-CHOP chemotherapy completed in July 2019 with good response.  He tolerated treatment fairly well but did have worsening anemia, neuropathy of the toes, lower extremity edema and hypokalemia.  He was given furosemide 20 mg daily as needed for the lower extremity edema and was placed on potassium chloride 20 mEq daily.  Furosemide and potassium were subsequently  discontinued.  He also had an episode of acute cholecystitis and required cholecystectomy at the end of July.  He had residual disease in the abdomen on CT imaging in July, so received consolidation radiation to the mesenteric mass completed in October 2019.  PET scan in December revealed a persistent mesenteric mass with a lower SUV than previously.    CT abdomen and pelvis in April 2020 revealed a persistent, but stable soft tissue density in the central small bowel mesentery encasing the mesenteric vessels and measuring over 7 cm in maximum diameter.  There was no evidence of new or progressive disease and no other adenopathy.  There were multiple bladder calculi. CT chest, abdomen and pelvis in August revealed a slight decrease in the small bowel mesenteric nodal mass now measuring 7.3 by 2.4 cm with a stable adjacent soft tissue lesion measuring 2.1 cm.  There was no evidence of new or progressive disease. There were persistent bladder calculi.  There were changes in the liver consistent with hepatic steatosis.  CT chest, abdomen and pelvis in February 2021 revealed continued decrease in the central mesenteric soft tissue mass measuring 6.9 by 1.7 cm.  CT chest, abdomen and pelvis in October 2021 revealed stable matted soft tissue density in the small bowel mesentery consistent with treated lymphoma.  No new or progressive findings were seen.    INTERVAL HISTORY:  [name] is here today for repeat clinical assessment. He denies fevers, chills or night  sweats. He denies significant fatigue.  He denies palpable adenopathy. He denies pain. His appetite is good. His weight has been stable.  Prior to his visit today, he had repeat CT chest, abdomen and pelvis.  REVIEW OF SYSTEMS:  Review of Systems  Constitutional:  Negative for appetite change, chills, fatigue, fever and unexpected weight change.  HENT:   Negative for lump/mass, mouth sores and sore throat.   Respiratory:  Negative for cough and shortness  of breath.   Cardiovascular:  Negative for chest pain and leg swelling.  Gastrointestinal:  Negative for abdominal pain, constipation, diarrhea, nausea and vomiting.  Genitourinary:  Negative for difficulty urinating, dysuria, frequency and hematuria.   Musculoskeletal:  Negative for arthralgias, back pain and myalgias.  Skin:  Negative for itching, rash and wound.  Neurological:  Negative for dizziness, extremity weakness, headaches, light-headedness and numbness.  Hematological:  Negative for adenopathy.  Psychiatric/Behavioral:  Negative for depression and sleep disturbance. The patient is not nervous/anxious.     VITALS:  Blood pressure (!) 142/78, pulse 65, temperature 97.8 F (36.6 C), temperature source Oral, resp. rate 20, height 5\' 11"  (1.803 m), weight 179 lb 11.2 oz (81.5 kg), SpO2 98 %.  Wt Readings from Last 3 Encounters:  06/24/21 179 lb 11.2 oz (81.5 kg)  12/15/20 179 lb 9.6 oz (81.5 kg)    Body mass index is 25.06 kg/m.  Performance status (ECOG): 0 - Asymptomatic  PHYSICAL EXAM:  Physical Exam Vitals and nursing note reviewed.  Constitutional:      General: He is not in acute distress.    Appearance: Normal appearance. He is normal weight.  HENT:     Head: Normocephalic and atraumatic.     Mouth/Throat:     Mouth: Mucous membranes are moist.     Pharynx: Oropharynx is clear. No oropharyngeal exudate or posterior oropharyngeal erythema.  Eyes:     General: No scleral icterus.    Extraocular Movements: Extraocular movements intact.     Conjunctiva/sclera: Conjunctivae normal.     Pupils: Pupils are equal, round, and reactive to light.  Cardiovascular:     Rate and Rhythm: Normal rate and regular rhythm.     Heart sounds: Normal heart sounds. No murmur heard.   No friction rub. No gallop.  Pulmonary:     Effort: Pulmonary effort is normal.     Breath sounds: Normal breath sounds. No wheezing, rhonchi or rales.  Abdominal:     General: Bowel sounds are  normal. There is no distension.     Palpations: Abdomen is soft. There is no mass.     Tenderness: There is no abdominal tenderness.  Musculoskeletal:        General: Normal range of motion.     Cervical back: Normal range of motion and neck supple. No tenderness.     Right lower leg: No edema.     Left lower leg: No edema.  Lymphadenopathy:     Cervical: No cervical adenopathy.  Skin:    General: Skin is warm and dry.     Coloration: Skin is not jaundiced.     Findings: No rash.  Neurological:     Mental Status: He is alert and oriented to person, place, and time.     Cranial Nerves: No cranial nerve deficit.  Psychiatric:        Mood and Affect: Mood normal.        Behavior: Behavior normal.        Thought Content: Thought content  normal.   LABS:   CBC Latest Ref Rng & Units 06/22/2021 12/15/2020 12/15/2020  WBC - 5.2 5.4 5.4  Hemoglobin 13.5 - 17.5 13.9 13.8 13.8  Hematocrit 41 - 53 41 41 41  Platelets 150 - 399 204 216 216   CMP Latest Ref Rng & Units 06/22/2021 12/15/2020 02/16/2015  Glucose 65 - 99 mg/dL - - 89  BUN 4 - 21 11 10 11   Creatinine 0.6 - 1.3 0.9 0.8 0.90  Sodium 137 - 147 141 140 140  Potassium 3.4 - 5.3 3.9 4.0 4.4  Chloride 99 - 108 104 104 102  CO2 13 - 22 32(A) 29(A) -  Calcium 8.7 - 10.7 8.8 9.2 -  Alkaline Phos 25 - 125 66 69 -  AST 14 - 40 37 29 -  ALT 10 - 40 28 27 -     No results found for: CEA1 / No results found for: CEA1 No results found for: PSA1 No results found for: TIR443 No results found for: CAN125  No results found for: TOTALPROTELP, ALBUMINELP, A1GS, A2GS, BETS, BETA2SER, GAMS, MSPIKE, SPEI No results found for: TIBC, FERRITIN, IRONPCTSAT Lab Results  Component Value Date   LDH 398 06/22/2021   LDH 132 12/15/2020    STUDIES:  No results found.  CT chest, abdomen and pelvis from 06/22/21 revealed stable matted soft tissue density in the small bowel mesentery consistent with treated lymphoma.  No new or progressive findings  in the chest, abdomen or pelvis.  Numerous layering dependent bladder calculi measuring up to 7 mm were similar to previous.  HISTORY:  No past medical history on file.  No past surgical history on file.  No family history on file.  Social History:  reports that he has never smoked. He has never used smokeless tobacco. He reports that he does not drink alcohol and does not use drugs.The patient is alone today.  Allergies: No Known Allergies  Current Medications: Current Outpatient Medications  Medication Sig Dispense Refill   Wheat Dextrin (BENEFIBER DRINK MIX PO) Take 1 Dose by mouth daily. 2 spoonfulls daily     diclofenac (VOLTAREN) 75 MG EC tablet Take 75 mg by mouth 2 (two) times daily as needed.     finasteride (PROSCAR) 5 MG tablet Take 5 mg by mouth daily.     levothyroxine (SYNTHROID) 25 MCG tablet Take 25 mcg by mouth at bedtime.     Multiple Vitamin (MULTIVITAMIN WITH MINERALS) TABS tablet Take 1 tablet by mouth daily. Multivitamin with minerals/herbal complex 2 tabs daily     Omeprazole Magnesium 20.6 (20 Base) MG CPDR Take by mouth.     simvastatin (ZOCOR) 20 MG tablet Take 20 mg by mouth at bedtime.     No current facility-administered medications for this visit.     ASSESSMENT & PLAN:   Assessment/Plan:  Torre Schaumburg is a 70 y.o. male with a history of stage IIA diffuse large B-cell lymphoma status post R-CHOP chemotherapy and consolidative radiation therapy. There was a stable mesenteric soft tissue mass and adjacent soft tissue lesion without evidence of progression on the most recent CT imaging.  We will plan to see him back in 6 months with a CBC, comprehensive metabolic panel, LDH and CT chest, abdomen and pelvis. The patient understands the plans discussed today and is in agreement with them.  He knows to contact our office if he develops concerns prior to his next appointment.    Marvia Pickles, PA-C

## 2021-06-24 NOTE — Assessment & Plan Note (Signed)
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 He was treated with 6 cycles of R-CHOP with complete remission. He remains without evidence of recurrence. We will plan to see him back in 6 months with a CBC, comprehensive metabolic panel and LDH for repeat clinical assessment.

## 2021-06-24 NOTE — Telephone Encounter (Signed)
Per 10/28 los next appt scheduled and given to patient

## 2021-06-28 ENCOUNTER — Encounter: Payer: Self-pay | Admitting: Hematology and Oncology

## 2021-06-30 ENCOUNTER — Encounter: Payer: Self-pay | Admitting: Oncology

## 2021-11-30 DIAGNOSIS — R972 Elevated prostate specific antigen [PSA]: Secondary | ICD-10-CM | POA: Diagnosis not present

## 2021-11-30 DIAGNOSIS — N401 Enlarged prostate with lower urinary tract symptoms: Secondary | ICD-10-CM | POA: Diagnosis not present

## 2021-11-30 DIAGNOSIS — N210 Calculus in bladder: Secondary | ICD-10-CM | POA: Diagnosis not present

## 2021-12-12 ENCOUNTER — Other Ambulatory Visit: Payer: Self-pay | Admitting: Hematology and Oncology

## 2021-12-12 DIAGNOSIS — C8333 Diffuse large B-cell lymphoma, intra-abdominal lymph nodes: Secondary | ICD-10-CM

## 2021-12-15 ENCOUNTER — Telehealth: Payer: Self-pay | Admitting: Hematology and Oncology

## 2021-12-15 NOTE — Telephone Encounter (Signed)
CT Chest/Abd/Pel has been scheduled for 12/19/21 @ 3 pm ; check in at 2 pm. ? ?Pt will have labs that day (lab appt for 12/23/21 was cancelled so pt just needs to come in at the time scheduled with Dr.McCarty)  ?Unable to reach pt via phone,LVM. Requested he give [place] a call ASAP to go over instructions for this scan since he will need to pick up contrast either today or tomorrow. ?

## 2021-12-19 DIAGNOSIS — C8333 Diffuse large B-cell lymphoma, intra-abdominal lymph nodes: Secondary | ICD-10-CM | POA: Diagnosis not present

## 2021-12-19 DIAGNOSIS — N210 Calculus in bladder: Secondary | ICD-10-CM | POA: Diagnosis not present

## 2021-12-19 DIAGNOSIS — I700 Atherosclerosis of aorta: Secondary | ICD-10-CM | POA: Diagnosis not present

## 2021-12-19 LAB — COMPREHENSIVE METABOLIC PANEL
Albumin: 4.5 (ref 3.5–5.0)
Calcium: 9.2 (ref 8.7–10.7)

## 2021-12-19 LAB — CBC AND DIFFERENTIAL
HCT: 43 (ref 41–53)
Hemoglobin: 13.9 (ref 13.5–17.5)
Neutrophils Absolute: 2.58
Platelets: 218 10*3/uL (ref 150–400)
WBC: 5.6

## 2021-12-19 LAB — HEPATIC FUNCTION PANEL
ALT: 38 U/L (ref 10–40)
AST: 44 — AB (ref 14–40)
Alkaline Phosphatase: 62 (ref 25–125)
Bilirubin, Total: 0.6

## 2021-12-19 LAB — BASIC METABOLIC PANEL
BUN: 10 (ref 4–21)
CO2: 34 — AB (ref 13–22)
Chloride: 103 (ref 99–108)
Creatinine: 0.9 (ref 0.6–1.3)
Glucose: 98
Potassium: 4.3 mEq/L (ref 3.5–5.1)
Sodium: 142 (ref 137–147)

## 2021-12-19 LAB — CBC: RBC: 4.46 (ref 3.87–5.11)

## 2021-12-20 ENCOUNTER — Encounter: Payer: Self-pay | Admitting: Hematology and Oncology

## 2021-12-22 NOTE — Progress Notes (Signed)
?[name]  ?[address] ?[name],  [name] ?[phone] ? ?Clinic Day:  12/23/21 ? ?Referring physician: Street, Sharon Mt, * ? ? ?CHIEF COMPLAINT:  ?CC:   Stage IIA diffuse large B-cell lymphoma ? ?Current Treatment:   Observation  ? ? ?HISTORY OF PRESENT ILLNESS:  ?Thomas Lucas is a 71 y.o. 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  He underwent open repair with mesh placement and was found to have an incidental mass involving the distal hernia sac and vas deferens.  Numerous stains were negative but this did stain positive for leukocyte common antigen and CD 45.  Additional stains showed positivity for CD 20, CD 10, BCL 6, BCL 2, CD 21, and CD 30.  This is felt to be can consistent with a diffuse large B-cell lymphoma of germinal center origin, high-grade.  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  CT chest, abdomen, and pelvis revealed a large nodal mass within the mesentery measuring 10.8 x 7.1 by 13.5 cm and a portacaval node measuring 2.8 cm, as well as an abnormal soft tissue infiltration within the retrocrural and retroperitoneal region of the abdomen and encasing the abdominal aorta measuring 3.3 cm x 4.6 cm and is 9.4 cm in length.  There were also multiple stones in the dependent portion of the bladder.  He was found to have iron deficiency anemia and was treated with IV iron in the form of Feraheme in March 2019.  He received 6 cycles of R-CHOP chemotherapy completed in July 2019 with good response.  He tolerated treatment fairly well but did have worsening anemia, neuropathy of the toes, lower extremity edema and hypokalemia.  He was given furosemide 20 mg daily as needed for the lower extremity edema and was placed on potassium chloride 20 mEq daily.  Furosemide and potassium were subsequently  discontinued.  He also had an episode of acute cholecystitis and required cholecystectomy at the end of July.  He had residual disease in the abdomen on CT imaging in July, so received consolidation radiation to the mesenteric mass completed in October 2019.  PET scan in December revealed a persistent mesenteric mass with a lower SUV than previously.   ? ?CT abdomen and pelvis in April 2020 revealed a persistent, but stable soft tissue density in the central small bowel mesentery encasing the mesenteric vessels and measuring over 7 cm in maximum diameter.  There was no evidence of new or progressive disease and no other adenopathy.  There were multiple bladder calculi. CT chest, abdomen and pelvis in August revealed a slight decrease in the small bowel mesenteric nodal mass now measuring 7.3 by 2.4 cm with a stable adjacent soft tissue lesion measuring 2.1 cm.  There was no evidence of new or progressive disease. There were persistent bladder calculi.  There were changes in the liver consistent with hepatic steatosis. CT chest, abdomen and pelvis in February 2021 revealed continued decrease in the central mesenteric soft tissue mass measuring 6.9 by 1.7 cm.  CT chest, abdomen and pelvis in October 2021 revealed stable matted soft tissue density in the small bowel mesentery consistent with treated lymphoma.  Repeat CT scans in October 2022 revealed the same. ? ?INTERVAL HISTORY:  ?Thomas Lucas is here today for repeat clinical assessment.  He feels well and has hearing  aids now.  Dr. Nila Nephew will be evaluating him regarding his numerous bladder stones, and so I will send him a copy of this current CT report.  The CT report of April 2023 still reveals stable matted soft tissue of the small bowel mesentery consistent with treated lymphoma.  He also has numerous layering bladder stones.  CBC and CMP are unremarkable. He denies fevers, chills or night sweats. He denies significant fatigue.  He denies palpable adenopathy. He denies  pain. His appetite is good. His weight has increased nearly 5 pounds.   ?REVIEW OF SYSTEMS:  ?Review of Systems  ?Constitutional:  Negative for appetite change, chills, fatigue, fever and unexpected weight change.  ?HENT:   Negative for lump/mass, mouth sores and sore throat.   ?Respiratory:  Negative for cough and shortness of breath.   ?Cardiovascular:  Negative for chest pain and leg swelling.  ?Gastrointestinal:  Negative for abdominal pain, constipation, diarrhea, nausea and vomiting.  ?Genitourinary:  Negative for difficulty urinating, dysuria, frequency and hematuria.   ?Musculoskeletal:  Negative for arthralgias, back pain and myalgias.  ?Skin:  Negative for itching, rash and wound.  ?Neurological:  Negative for dizziness, extremity weakness, headaches, light-headedness and numbness.  ?Hematological:  Negative for adenopathy.  ?Psychiatric/Behavioral:  Negative for depression and sleep disturbance. The patient is not nervous/anxious.    ? ?VITALS:  ?Blood pressure (!) 166/80, pulse 75, temperature 97.9 ?F (36.6 ?C), temperature source Oral, resp. rate 18, height '5\' 11"'$  (1.803 m), weight 183 lb 12.8 oz (83.4 kg), SpO2 96 %.  ?Wt Readings from Last 3 Encounters:  ?12/23/21 183 lb 12.8 oz (83.4 kg)  ?06/24/21 179 lb 11.2 oz (81.5 kg)  ?12/15/20 179 lb 9.6 oz (81.5 kg)  ?  ?Body mass index is 25.63 kg/m?. ? ?Performance status (ECOG): 0 - Asymptomatic ? ?PHYSICAL EXAM:  ?Physical Exam ?Vitals and nursing note reviewed.  ?Constitutional:   ?   General: He is not in acute distress. ?   Appearance: Normal appearance. He is normal weight.  ?HENT:  ?   Head: Normocephalic and atraumatic.  ?   Mouth/Throat:  ?   Mouth: Mucous membranes are moist.  ?   Pharynx: Oropharynx is clear. No oropharyngeal exudate or posterior oropharyngeal erythema.  ?Eyes:  ?   General: No scleral icterus. ?   Extraocular Movements: Extraocular movements intact.  ?   Conjunctiva/sclera: Conjunctivae normal.  ?   Pupils: Pupils are equal,  round, and reactive to light.  ?Cardiovascular:  ?   Rate and Rhythm: Normal rate and regular rhythm.  ?   Heart sounds: Normal heart sounds. No murmur heard. ?  No friction rub. No gallop.  ?Pulmonary:  ?   Effort: Pulmonary effort is normal.  ?   Breath sounds: Normal breath sounds. No wheezing, rhonchi or rales.  ?Abdominal:  ?   General: Bowel sounds are normal. There is no distension.  ?   Palpations: Abdomen is soft. There is no mass.  ?   Tenderness: There is no abdominal tenderness.  ?Musculoskeletal:     ?   General: Normal range of motion.  ?   Cervical back: Normal range of motion and neck supple. No tenderness.  ?   Right lower leg: No edema.  ?   Left lower leg: No edema.  ?Lymphadenopathy:  ?   Cervical: No cervical adenopathy.  ?Skin: ?   General: Skin is warm and dry.  ?   Coloration: Skin is not jaundiced.  ?  Findings: No rash.  ?Neurological:  ?   Mental Status: He is alert and oriented to person, place, and time.  ?   Cranial Nerves: No cranial nerve deficit.  ?Psychiatric:     ?   Mood and Affect: Mood normal.     ?   Behavior: Behavior normal.     ?   Thought Content: Thought content normal.  ? ?LABS:  ? ? ?  Latest Ref Rng & Units 12/19/2021  ? 12:00 AM 06/22/2021  ? 12:00 AM 12/15/2020  ? 12:00 AM  ?CBC  ?WBC  5.6      5.2      5.4       ? 5.4       ?Hemoglobin 13.5 - 17.5 13.9      13.9      13.8       ? 13.8       ?Hematocrit 41 - 53 43      41      41       ? 41       ?Platelets 150 - 400 K/uL 218      204      216       ? 216       ?  ? This result is from an external source.  ? Multiple values from one day are sorted in reverse-chronological order  ? ? ?  Latest Ref Rng & Units 12/19/2021  ? 12:00 AM 06/22/2021  ? 12:00 AM 12/15/2020  ? 12:00 AM  ?CMP  ?BUN 4 - '21 10      11      10       '$ ?Creatinine 0.6 - 1.3 0.9      0.9      0.8       ?Sodium 137 - 147 142      141      140       ?Potassium 3.5 - 5.1 mEq/L 4.3      3.9      4.0       ?Chloride 99 - 108 103      104      104       ?CO2 13  - 22 34      32      29       ?Calcium 8.7 - 10.7 9.2      8.8      9.2       ?Alkaline Phos 25 - 125 62      66      69       ?AST 14 - 40 44      37      29       ?ALT 10 - 40 U/L 38      28      27       ?

## 2021-12-23 ENCOUNTER — Inpatient Hospital Stay: Payer: Medicare HMO | Attending: Oncology | Admitting: Oncology

## 2021-12-23 ENCOUNTER — Other Ambulatory Visit: Payer: Medicare HMO

## 2021-12-23 ENCOUNTER — Other Ambulatory Visit: Payer: Self-pay | Admitting: Oncology

## 2021-12-23 ENCOUNTER — Encounter: Payer: Self-pay | Admitting: Oncology

## 2021-12-23 ENCOUNTER — Telehealth: Payer: Self-pay | Admitting: Oncology

## 2021-12-23 ENCOUNTER — Other Ambulatory Visit: Payer: Self-pay

## 2021-12-23 VITALS — BP 166/80 | HR 75 | Temp 97.9°F | Resp 18 | Ht 71.0 in | Wt 183.8 lb

## 2021-12-23 DIAGNOSIS — C8333 Diffuse large B-cell lymphoma, intra-abdominal lymph nodes: Secondary | ICD-10-CM | POA: Diagnosis not present

## 2021-12-23 NOTE — Telephone Encounter (Signed)
Per 12/23/21 los next appt scheduled and confirmed with patient ?

## 2022-01-31 DIAGNOSIS — N210 Calculus in bladder: Secondary | ICD-10-CM | POA: Diagnosis not present

## 2022-01-31 DIAGNOSIS — Z9049 Acquired absence of other specified parts of digestive tract: Secondary | ICD-10-CM | POA: Diagnosis not present

## 2022-03-17 DIAGNOSIS — N401 Enlarged prostate with lower urinary tract symptoms: Secondary | ICD-10-CM | POA: Diagnosis not present

## 2022-03-17 DIAGNOSIS — N210 Calculus in bladder: Secondary | ICD-10-CM | POA: Diagnosis not present

## 2022-03-17 DIAGNOSIS — R972 Elevated prostate specific antigen [PSA]: Secondary | ICD-10-CM | POA: Diagnosis not present

## 2022-03-30 DIAGNOSIS — N401 Enlarged prostate with lower urinary tract symptoms: Secondary | ICD-10-CM | POA: Diagnosis not present

## 2022-03-30 DIAGNOSIS — Z8572 Personal history of non-Hodgkin lymphomas: Secondary | ICD-10-CM | POA: Diagnosis not present

## 2022-03-30 DIAGNOSIS — N210 Calculus in bladder: Secondary | ICD-10-CM | POA: Diagnosis not present

## 2022-03-30 DIAGNOSIS — R350 Frequency of micturition: Secondary | ICD-10-CM | POA: Diagnosis not present

## 2022-04-05 DIAGNOSIS — N210 Calculus in bladder: Secondary | ICD-10-CM | POA: Diagnosis not present

## 2022-04-07 DIAGNOSIS — N401 Enlarged prostate with lower urinary tract symptoms: Secondary | ICD-10-CM | POA: Diagnosis not present

## 2022-04-07 DIAGNOSIS — N210 Calculus in bladder: Secondary | ICD-10-CM | POA: Diagnosis not present

## 2022-04-07 DIAGNOSIS — R972 Elevated prostate specific antigen [PSA]: Secondary | ICD-10-CM | POA: Diagnosis not present

## 2022-05-03 DIAGNOSIS — H5203 Hypermetropia, bilateral: Secondary | ICD-10-CM | POA: Diagnosis not present

## 2022-06-01 DIAGNOSIS — R7302 Impaired glucose tolerance (oral): Secondary | ICD-10-CM | POA: Diagnosis not present

## 2022-06-01 DIAGNOSIS — R972 Elevated prostate specific antigen [PSA]: Secondary | ICD-10-CM | POA: Diagnosis not present

## 2022-06-01 DIAGNOSIS — I10 Essential (primary) hypertension: Secondary | ICD-10-CM | POA: Diagnosis not present

## 2022-06-01 DIAGNOSIS — M1991 Primary osteoarthritis, unspecified site: Secondary | ICD-10-CM | POA: Diagnosis not present

## 2022-06-01 DIAGNOSIS — Z0000 Encounter for general adult medical examination without abnormal findings: Secondary | ICD-10-CM | POA: Diagnosis not present

## 2022-06-01 DIAGNOSIS — E039 Hypothyroidism, unspecified: Secondary | ICD-10-CM | POA: Diagnosis not present

## 2022-06-01 DIAGNOSIS — C8330 Diffuse large B-cell lymphoma, unspecified site: Secondary | ICD-10-CM | POA: Diagnosis not present

## 2022-06-01 DIAGNOSIS — E785 Hyperlipidemia, unspecified: Secondary | ICD-10-CM | POA: Diagnosis not present

## 2022-06-01 DIAGNOSIS — Z23 Encounter for immunization: Secondary | ICD-10-CM | POA: Diagnosis not present

## 2022-06-01 DIAGNOSIS — N400 Enlarged prostate without lower urinary tract symptoms: Secondary | ICD-10-CM | POA: Diagnosis not present

## 2022-06-01 DIAGNOSIS — J0140 Acute pansinusitis, unspecified: Secondary | ICD-10-CM | POA: Diagnosis not present

## 2022-06-01 DIAGNOSIS — K2960 Other gastritis without bleeding: Secondary | ICD-10-CM | POA: Diagnosis not present

## 2022-06-07 DIAGNOSIS — N401 Enlarged prostate with lower urinary tract symptoms: Secondary | ICD-10-CM | POA: Diagnosis not present

## 2022-06-07 DIAGNOSIS — R972 Elevated prostate specific antigen [PSA]: Secondary | ICD-10-CM | POA: Diagnosis not present

## 2022-06-23 ENCOUNTER — Ambulatory Visit: Payer: Medicare HMO | Admitting: Oncology

## 2022-06-23 ENCOUNTER — Other Ambulatory Visit: Payer: Medicare HMO

## 2022-07-03 ENCOUNTER — Inpatient Hospital Stay (INDEPENDENT_AMBULATORY_CARE_PROVIDER_SITE_OTHER): Payer: Medicare HMO | Admitting: Oncology

## 2022-07-03 ENCOUNTER — Other Ambulatory Visit: Payer: Self-pay | Admitting: Oncology

## 2022-07-03 ENCOUNTER — Inpatient Hospital Stay: Payer: Medicare HMO | Attending: Oncology

## 2022-07-03 ENCOUNTER — Telehealth: Payer: Self-pay | Admitting: Oncology

## 2022-07-03 ENCOUNTER — Encounter: Payer: Self-pay | Admitting: Oncology

## 2022-07-03 VITALS — BP 179/86 | HR 69 | Temp 97.7°F | Resp 18 | Ht 71.0 in | Wt 185.9 lb

## 2022-07-03 DIAGNOSIS — C8333 Diffuse large B-cell lymphoma, intra-abdominal lymph nodes: Secondary | ICD-10-CM | POA: Insufficient documentation

## 2022-07-03 DIAGNOSIS — Z923 Personal history of irradiation: Secondary | ICD-10-CM | POA: Diagnosis not present

## 2022-07-03 LAB — CMP (CANCER CENTER ONLY)
ALT: 33 U/L (ref 0–44)
AST: 33 U/L (ref 15–41)
Albumin: 4.1 g/dL (ref 3.5–5.0)
Alkaline Phosphatase: 53 U/L (ref 38–126)
Anion gap: 5 (ref 5–15)
BUN: 15 mg/dL (ref 8–23)
CO2: 28 mmol/L (ref 22–32)
Calcium: 8.9 mg/dL (ref 8.9–10.3)
Chloride: 105 mmol/L (ref 98–111)
Creatinine: 1.2 mg/dL (ref 0.61–1.24)
GFR, Estimated: >60 mL/min (ref >60)
Glucose, Bld: 128 mg/dL — ABNORMAL HIGH (ref 70–99)
Potassium: 4 mmol/L (ref 3.5–5.1)
Sodium: 138 mmol/L (ref 135–145)
Total Bilirubin: 0.5 mg/dL (ref 0.3–1.2)
Total Protein: 6.7 g/dL (ref 6.5–8.1)

## 2022-07-03 LAB — CBC WITH DIFFERENTIAL (CANCER CENTER ONLY)
Abs Immature Granulocytes: 0.02 10*3/uL (ref 0.00–0.07)
Basophils Absolute: 0 10*3/uL (ref 0.0–0.1)
Basophils Relative: 1 %
Eosinophils Absolute: 0.1 10*3/uL (ref 0.0–0.5)
Eosinophils Relative: 1 %
HCT: 43 % (ref 39.0–52.0)
Hemoglobin: 13.8 g/dL (ref 13.0–17.0)
Immature Granulocytes: 0 %
Lymphocytes Relative: 40 %
Lymphs Abs: 2.3 10*3/uL (ref 0.7–4.0)
MCH: 32.3 pg (ref 26.0–34.0)
MCHC: 32.1 g/dL (ref 30.0–36.0)
MCV: 100.7 fL — ABNORMAL HIGH (ref 80.0–100.0)
Monocytes Absolute: 0.5 10*3/uL (ref 0.1–1.0)
Monocytes Relative: 9 %
Neutro Abs: 2.8 10*3/uL (ref 1.7–7.7)
Neutrophils Relative %: 49 %
Platelet Count: 224 10*3/uL (ref 150–400)
RBC: 4.27 MIL/uL (ref 4.22–5.81)
RDW: 12.4 % (ref 11.5–15.5)
WBC Count: 5.8 10*3/uL (ref 4.0–10.5)
nRBC: 0 % (ref 0.0–0.2)

## 2022-07-03 LAB — LACTATE DEHYDROGENASE: LDH: 145 U/L (ref 98–192)

## 2022-07-03 NOTE — Progress Notes (Signed)
[facility]  [address] [place],  [place]  [address] [phone]  Clinic Day:  07/03/22  Referring physician: Emmaline Kluver, *   CHIEF COMPLAINT:  CC:   Stage IIA diffuse large B-cell lymphoma  Current Treatment:   Observation    HISTORY OF PRESENT ILLNESS:  Thomas Lucas is a 71 y.o. 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  He underwent open repair with mesh placement and was found to have an incidental mass involving the distal hernia sac and vas deferens.  Numerous stains were negative but this did stain positive for leukocyte common antigen and CD 45.  Additional stains showed positivity for CD 20, CD 10, BCL 6, BCL 2, CD 21, and CD 30.  This is felt to be can consistent with a diffuse large B-cell lymphoma of germinal center origin, high-grade.  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  CT chest, abdomen, and pelvis revealed a large nodal mass within the mesentery measuring 10.8 x 7.1 by 13.5 cm and a portacaval node measuring 2.8 cm, as well as an abnormal soft tissue infiltration within the retrocrural and retroperitoneal region of the abdomen and encasing the abdominal aorta measuring 3.3 cm x 4.6 cm and is 9.4 cm in length.  There were also multiple stones in the dependent portion of the bladder.  He was found to have iron deficiency anemia and was treated with IV iron in the form of Feraheme in March 2019.  He received 6 cycles of R-CHOP chemotherapy completed in July 2019 with good response.  He tolerated treatment fairly well but did have worsening anemia, neuropathy of the toes, lower extremity edema and hypokalemia.  He was given furosemide 20 mg daily as needed for the lower extremity edema and was placed on potassium chloride 20 mEq daily.  Furosemide and potassium were subsequently  discontinued.  He also had an episode of acute cholecystitis and required cholecystectomy at the end of July.  He had residual disease in the abdomen on CT imaging in July, so received consolidation radiation to the mesenteric mass completed in October 2019.  PET scan in December revealed a persistent mesenteric mass with a lower SUV than previously.    CT abdomen and pelvis in April 2020 revealed a persistent, but stable soft tissue density in the central small bowel mesentery encasing the mesenteric vessels and measuring over 7 cm in maximum diameter.  There was no evidence of new or progressive disease and no other adenopathy.  There were multiple bladder calculi. CT chest, abdomen and pelvis in August revealed a slight decrease in the small bowel mesenteric nodal mass now measuring 7.3 by 2.4 cm with a stable adjacent soft tissue lesion measuring 2.1 cm.  There was no evidence of new or progressive disease. There were persistent bladder calculi.  There were changes in the liver consistent with hepatic steatosis. CT chest, abdomen and pelvis in February 2021 revealed continued decrease in the central mesenteric soft tissue mass measuring 6.9 by 1.7 cm.  CT chest, abdomen and pelvis in October 2021 revealed stable matted soft tissue density in the small bowel mesentery consistent with treated lymphoma.  Repeat CT scans in October 2022 revealed the same.  INTERVAL HISTORY:  Thomas Lucas is here today for repeat clinical assessment.  He feels well. The CT report  of April 2023 still reveals stable matted soft tissue of the small bowel mesentery consistent with treated lymphoma.  He also has numerous layering bladder stones.  Dr. Bridgett Larsson remove those in August.  CBC and CMP are unremarkable. He denies fevers, chills or night sweats. He denies significant fatigue.  He denies palpable adenopathy. He denies pain. His appetite is good. His weight has increased 2 pounds.   REVIEW OF SYSTEMS:  Review of Systems   Constitutional:  Negative for appetite change, chills, fatigue, fever and unexpected weight change.  HENT:   Negative for lump/mass, mouth sores and sore throat.   Respiratory:  Negative for cough and shortness of breath.   Cardiovascular:  Negative for chest pain and leg swelling.  Gastrointestinal:  Negative for abdominal pain, constipation, diarrhea, nausea and vomiting.  Genitourinary:  Negative for difficulty urinating, dysuria, frequency and hematuria.   Musculoskeletal:  Negative for arthralgias, back pain and myalgias.  Skin:  Negative for itching, rash and wound.  Neurological:  Negative for dizziness, extremity weakness, headaches, light-headedness and numbness.  Hematological:  Negative for adenopathy.  Psychiatric/Behavioral:  Negative for depression and sleep disturbance. The patient is not nervous/anxious.      VITALS:  Blood pressure (!) 179/86, pulse 69, temperature 97.7 F (36.5 C), resp. rate 18, height '5\' 11"'$  (1.803 m), weight 185 lb 14.4 oz (84.3 kg), SpO2 98 %.  Wt Readings from Last 3 Encounters:  07/03/22 185 lb 14.4 oz (84.3 kg)  12/23/21 183 lb 12.8 oz (83.4 kg)  06/24/21 179 lb 11.2 oz (81.5 kg)    Body mass index is 25.93 kg/m.  Performance status (ECOG): 0 - Asymptomatic  PHYSICAL EXAM:  Physical Exam Vitals and nursing note reviewed.  Constitutional:      General: He is not in acute distress.    Appearance: Normal appearance. He is normal weight.  HENT:     Head: Normocephalic and atraumatic.     Mouth/Throat:     Mouth: Mucous membranes are moist.     Pharynx: Oropharynx is clear. No oropharyngeal exudate or posterior oropharyngeal erythema.  Eyes:     General: No scleral icterus.    Extraocular Movements: Extraocular movements intact.     Conjunctiva/sclera: Conjunctivae normal.     Pupils: Pupils are equal, round, and reactive to light.  Cardiovascular:     Rate and Rhythm: Normal rate and regular rhythm.     Heart sounds: Normal heart  sounds. No murmur heard.    No friction rub. No gallop.  Pulmonary:     Effort: Pulmonary effort is normal.     Breath sounds: Normal breath sounds. No wheezing, rhonchi or rales.  Abdominal:     General: Bowel sounds are normal. There is no distension.     Palpations: Abdomen is soft. There is no mass.     Tenderness: There is no abdominal tenderness.  Musculoskeletal:        General: Normal range of motion.     Cervical back: Normal range of motion and neck supple. No tenderness.     Right lower leg: No edema.     Left lower leg: No edema.  Lymphadenopathy:     Cervical: No cervical adenopathy.  Skin:    General: Skin is warm and dry.     Coloration: Skin is not jaundiced.     Findings: No rash.  Neurological:     Mental Status: He is alert and oriented to person, place, and time.     Cranial  Nerves: No cranial nerve deficit.  Psychiatric:        Mood and Affect: Mood normal.        Behavior: Behavior normal.        Thought Content: Thought content normal.    LABS:      Latest Ref Rng & Units 07/03/2022    2:09 PM 12/19/2021   12:00 AM 06/22/2021   12:00 AM  CBC  WBC 4.0 - 10.5 K/uL 5.8  5.6     5.2      Hemoglobin 13.0 - 17.0 g/dL 13.8  13.9     13.9      Hematocrit 39.0 - 52.0 % 43.0  43     41      Platelets 150 - 400 K/uL 224  218     204         This result is from an external source.      Latest Ref Rng & Units 07/03/2022    2:09 PM 12/19/2021   12:00 AM 06/22/2021   12:00 AM  CMP  Glucose 70 - 99 mg/dL 128     BUN 8 - 23 mg/dL '15  10     11      '$ Creatinine 0.61 - 1.24 mg/dL 1.20  0.9     0.9      Sodium 135 - 145 mmol/L 138  142     141      Potassium 3.5 - 5.1 mmol/L 4.0  4.3     3.9      Chloride 98 - 111 mmol/L 105  103     104      CO2 22 - 32 mmol/L 28  34     32      Calcium 8.9 - 10.3 mg/dL 8.9  9.2     8.8      Total Protein 6.5 - 8.1 g/dL 6.7     Total Bilirubin 0.3 - 1.2 mg/dL 0.5     Alkaline Phos 38 - 126 U/L 53  62     66      AST 15 - 41  U/L 33  44     37      ALT 0 - 44 U/L 33  38     28         This result is from an external source.    Lab Results  Component Value Date   LDH 145 07/03/2022   LDH 398 06/22/2021   LDH 132 12/15/2020    STUDIES:    EXAM: 12/19/21 CT CHEST, ABDOMEN, AND PELVIS WITH CONTRAST  TECHNIQUE:  Multidetector CT imaging of the chest, abdomen and pelvis was  performed following the standard protocol during bolus  administration of intravenous contrast.  RADIATION DOSE REDUCTION: This exam was performed according to the  departmental dose-optimization program which includes automated  exposure control, adjustment of the mA and/or kV according to  patient size and/or use of iterative reconstruction technique.  CONTRAST: 100CC ISOVUE 370  COMPARISON: CT chest, abdomen and pelvis dated June 22, 2021  FINDINGS:  CT CHEST FINDINGS  Cardiovascular: Normal heart size. No pericardial effusion.  Atherosclerotic disease of the thoracic aorta. No suspicious filling  defects of the central pulmonary arteries.  Mediastinum/Nodes: Esophagus and thyroid are unremarkable. No  pathologically enlarged lymph nodes seen in the chest.  Lungs/Pleura: Central airways are patent. No consolidation, pleural  effusion or pneumothorax.  Musculoskeletal: No chest wall mass or suspicious bone lesions  identified.  CT ABDOMEN PELVIS FINDINGS  Hepatobiliary: No focal liver abnormality is seen. Status post  cholecystectomy. No biliary dilatation.  Pancreas: Unremarkable. No pancreatic ductal dilatation or surrounding inflammatory changes.  Spleen: Normal in size without focal abnormality.  Adrenals/Urinary Tract: Adrenal glands are unremarkable. Kidneys are  normal, without renal calculi, focal lesion, or hydronephrosis.  Numerous layering stones are seen in the bladder.  Stomach/Bowel: Stomach is within normal limits. Appendix appears  normal. Stable soft tissue of the small bowel mesentery. No evidence  of  bowel wall thickening, distention, or inflammatory changes.  Vascular/Lymphatic: Aortic atherosclerosis. No enlarged abdominal or  pelvic lymph nodes.  Reproductive: Prostate calcifications.  Other: No abdominal wall hernia or abnormality. No abdominopelvic  ascites.  Musculoskeletal: No acute or significant osseous findings.   IMPRESSION:  1. Stable matted soft tissue of the small bowel mesentery,  consistent with treated lymphoma. No evidence of progressive  disease.  2. Numerous layering stones are seen in the bladder.  3. Aortic Atherosclerosis (ICD10-I70.0).   Electronically Signed  By: Yetta Glassman M.D.  On: 12/19/2021 17:07    HISTORY:   Social History:  reports that he has never smoked. He has never used smokeless tobacco. He reports that he does not drink alcohol and does not use drugs.The patient is alone today.  Allergies: No Known Allergies  Current Medications: Current Outpatient Medications  Medication Sig Dispense Refill   diclofenac (VOLTAREN) 75 MG EC tablet Take 75 mg by mouth 2 (two) times daily as needed.     finasteride (PROSCAR) 5 MG tablet Take 5 mg by mouth daily.     levothyroxine (SYNTHROID) 25 MCG tablet Take 25 mcg by mouth at bedtime.     Multiple Vitamin (MULTIVITAMIN WITH MINERALS) TABS tablet Take 1 tablet by mouth daily. Multivitamin with minerals/herbal complex 2 tabs daily     Omeprazole Magnesium 20.6 (20 Base) MG CPDR Take by mouth.     simvastatin (ZOCOR) 20 MG tablet Take 20 mg by mouth at bedtime.     Wheat Dextrin (BENEFIBER DRINK MIX PO) Take 1 Dose by mouth daily. 2 spoonfulls daily     No current facility-administered medications for this visit.     ASSESSMENT & PLAN:   Assessment/Plan:   Stage IIA diffuse large B-cell lymphoma Status post R-CHOP chemotherapy and consolidative radiation therapy. There is a stable mesenteric soft tissue mass and adjacent soft tissue lesion without evidence of progression on the most recent CT  imaging, consistent with treated lymphoma.    Hypertension I have recommended that he follow-up with his primary care provider regarding his blood pressure if it remains elevated.  We will plan to see him back in 6 months with a CBC, comprehensive metabolic panel, LDH and CT chest, abdomen and pelvis. The patient understands the plans discussed today and is in agreement with them.  He knows to contact our office if he develops concerns prior to his next appointment.    Derwood Kaplan, MD

## 2022-07-03 NOTE — Telephone Encounter (Signed)
Contacted pt to schedule an appt per 07/03/22 LOS. Unable to reach via phone, voicemail was left.

## 2022-10-12 DIAGNOSIS — D225 Melanocytic nevi of trunk: Secondary | ICD-10-CM | POA: Diagnosis not present

## 2022-10-12 DIAGNOSIS — L814 Other melanin hyperpigmentation: Secondary | ICD-10-CM | POA: Diagnosis not present

## 2022-10-12 DIAGNOSIS — L570 Actinic keratosis: Secondary | ICD-10-CM | POA: Diagnosis not present

## 2022-10-12 DIAGNOSIS — L578 Other skin changes due to chronic exposure to nonionizing radiation: Secondary | ICD-10-CM | POA: Diagnosis not present

## 2022-12-06 DIAGNOSIS — R972 Elevated prostate specific antigen [PSA]: Secondary | ICD-10-CM | POA: Diagnosis not present

## 2022-12-06 DIAGNOSIS — N401 Enlarged prostate with lower urinary tract symptoms: Secondary | ICD-10-CM | POA: Diagnosis not present

## 2022-12-29 DIAGNOSIS — C8333 Diffuse large B-cell lymphoma, intra-abdominal lymph nodes: Secondary | ICD-10-CM | POA: Diagnosis not present

## 2022-12-29 DIAGNOSIS — C8300 Small cell B-cell lymphoma, unspecified site: Secondary | ICD-10-CM | POA: Diagnosis not present

## 2022-12-29 DIAGNOSIS — N210 Calculus in bladder: Secondary | ICD-10-CM | POA: Diagnosis not present

## 2022-12-29 LAB — HEPATIC FUNCTION PANEL
ALT: 37 U/L (ref 10–40)
AST: 41 — AB (ref 14–40)
Alkaline Phosphatase: 69 (ref 25–125)
Bilirubin, Total: 0.7

## 2022-12-29 LAB — BASIC METABOLIC PANEL
BUN: 12 (ref 4–21)
CO2: 25 — AB (ref 13–22)
Chloride: 106 (ref 99–108)
Creatinine: 0.8 (ref 0.6–1.3)
Glucose: 100
Potassium: 4.4 mEq/L (ref 3.5–5.1)
Sodium: 140 (ref 137–147)

## 2022-12-29 LAB — CBC: RBC: 4.46 (ref 3.87–5.11)

## 2022-12-29 LAB — CBC AND DIFFERENTIAL
HCT: 43 (ref 41–53)
Hemoglobin: 14.7 (ref 13.5–17.5)
Neutrophils Absolute: 2.75
Platelets: 216 10*3/uL (ref 150–400)
WBC: 5.5

## 2022-12-29 LAB — COMPREHENSIVE METABOLIC PANEL
Albumin: 4.4 (ref 3.5–5.0)
Calcium: 9.2 (ref 8.7–10.7)

## 2023-01-01 ENCOUNTER — Ambulatory Visit: Payer: Medicare HMO | Admitting: Oncology

## 2023-01-02 NOTE — Progress Notes (Signed)
[facility]  [address] [place],  [place]  [address] [phone]  Clinic Day: 01/03/2023  Referring physician: Casper Harrison, Stephanie Coup, *  CHIEF COMPLAINT:  CC:   Stage IIA diffuse large B-cell lymphoma  Current Treatment:   Observation   HISTORY OF PRESENT ILLNESS:  Thomas Lucas is a 72 y.o. 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  He underwent open repair with mesh placement and was found to have an incidental mass involving the distal hernia sac and vas deferens.  Numerous stains were negative but this did stain positive for leukocyte common antigen and CD 45.  Additional stains showed positivity for CD 20, CD 10, BCL 6, BCL 2, CD 21, and CD 30.  This is felt to be can consistent with a diffuse large B-cell lymphoma of germinal center origin, high-grade.  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  CT chest, abdomen, and pelvis revealed a large nodal mass within the mesentery measuring 10.8 x 7.1 by 13.5 cm and a portacaval node measuring 2.8 cm, as well as an abnormal soft tissue infiltration within the retrocrural and retroperitoneal region of the abdomen and encasing the abdominal aorta measuring 3.3 cm x 4.6 cm and is 9.4 cm in length.  There were also multiple stones in the dependent portion of the bladder.  He was found to have iron deficiency anemia and was treated with IV iron in the form of Feraheme in March 2019.  He received 6 cycles of R-CHOP chemotherapy completed in July 2019 with good response.  He tolerated treatment fairly well but did have worsening anemia, neuropathy of the toes, lower extremity edema and hypokalemia.  He was given furosemide 20 mg daily as needed for the lower extremity edema and was placed on potassium chloride 20 mEq daily.  Furosemide and potassium were subsequently  discontinued.  He also had an episode of acute cholecystitis and required cholecystectomy at the end of July.  He had residual disease in the abdomen on CT imaging in July, so received consolidation radiation to the mesenteric mass completed in October 2019.  PET scan in December revealed a persistent mesenteric mass with a lower SUV than previously.    CT abdomen and pelvis in April 2020 revealed a persistent, but stable soft tissue density in the central small bowel mesentery encasing the mesenteric vessels and measuring over 7 cm in maximum diameter.  There was no evidence of new or progressive disease and no other adenopathy.  There were multiple bladder calculi. CT chest, abdomen and pelvis in August revealed a slight decrease in the small bowel mesenteric nodal mass now measuring 7.3 by 2.4 cm with a stable adjacent soft tissue lesion measuring 2.1 cm.  There was no evidence of new or progressive disease. There were persistent bladder calculi.  There were changes in the liver consistent with hepatic steatosis. CT chest, abdomen and pelvis in February 2021 revealed continued decrease in the central mesenteric soft tissue mass measuring 6.9 by 1.7 cm.  CT chest, abdomen and pelvis in October 2021 revealed stable matted soft tissue density in the small bowel mesentery consistent with treated lymphoma.  Repeat CT scans in October 2022 revealed the same.  INTERVAL HISTORY:  [name] is here today for repeat clinical assessment for her stage IIA diffuse large B-cell lymphoma.  Patient  states that he feels well and has no complaints.  He had restaging scans at this time which look good.  There still is a soft tissue mass in the central small bowel mesentery which is unchanged and no definite adenopathy.  He also has multiple small calculi in the bladder but no obstruction or hydronephrosis.  His CBC and CMP are normal, including a blood sugar of 100.  He denies signs of infection such as sore throat, sinus drainage,  cough, or urinary symptoms.  He denies fevers or recurrent chills. He denies pain. He denies nausea, vomiting, chest pain, dyspnea or cough. His appetite is good and his weight has increased 3 pounds over last 6 months .  I think we can go to yearly follow-up and so we will see him back in 1 year with CBC, CMP, LDH, and CT scans of chest, abdomen and pelvis.    REVIEW OF SYSTEMS:  Review of Systems  Constitutional:  Negative for appetite change, chills, fatigue, fever and unexpected weight change.  HENT:   Negative for lump/mass, mouth sores and sore throat.   Respiratory:  Negative for cough and shortness of breath.   Cardiovascular:  Negative for chest pain and leg swelling.  Gastrointestinal:  Negative for abdominal pain, constipation, diarrhea, nausea and vomiting.  Genitourinary:  Negative for difficulty urinating, dysuria, frequency and hematuria.   Musculoskeletal:  Negative for arthralgias, back pain and myalgias.  Skin:  Negative for itching, rash and wound.  Neurological:  Negative for dizziness, extremity weakness, headaches, light-headedness and numbness.  Hematological:  Negative for adenopathy.  Psychiatric/Behavioral:  Negative for depression and sleep disturbance. The patient is not nervous/anxious.      VITALS:  Blood pressure (!) 155/84, pulse 77, temperature 98.6 F (37 C), temperature source Oral, resp. rate 18, height 5\' 11"  (1.803 m), weight 188 lb 9.6 oz (85.5 kg), SpO2 96 %.  Wt Readings from Last 3 Encounters:  01/03/23 188 lb 9.6 oz (85.5 kg)  07/03/22 185 lb 14.4 oz (84.3 kg)  12/23/21 183 lb 12.8 oz (83.4 kg)    Body mass index is 26.3 kg/m.  Performance status (ECOG): 0 - Asymptomatic  PHYSICAL EXAM:  Physical Exam Vitals and nursing note reviewed.  Constitutional:      General: He is not in acute distress.    Appearance: Normal appearance. He is normal weight.  HENT:     Head: Normocephalic and atraumatic.     Right Ear: Tympanic membrane, ear canal  and external ear normal.     Left Ear: Tympanic membrane, ear canal and external ear normal.     Nose: Nose normal.     Mouth/Throat:     Mouth: Mucous membranes are moist.     Pharynx: Oropharynx is clear. No oropharyngeal exudate or posterior oropharyngeal erythema.  Eyes:     General: No scleral icterus.    Extraocular Movements: Extraocular movements intact.     Conjunctiva/sclera: Conjunctivae normal.     Pupils: Pupils are equal, round, and reactive to light.  Cardiovascular:     Rate and Rhythm: Normal rate and regular rhythm.     Pulses: Normal pulses.     Heart sounds: Normal heart sounds. No murmur heard.    No friction rub. No gallop.  Pulmonary:     Effort: Pulmonary effort is normal.     Breath sounds: Normal breath sounds. No wheezing, rhonchi or rales.  Abdominal:     General: Bowel sounds are normal. There is no distension.  Palpations: Abdomen is soft. There is no mass.     Tenderness: There is no abdominal tenderness.  Musculoskeletal:        General: Normal range of motion.     Cervical back: Normal range of motion and neck supple. No tenderness.     Right lower leg: No edema.     Left lower leg: No edema.  Lymphadenopathy:     Cervical: No cervical adenopathy.  Skin:    General: Skin is warm and dry.     Coloration: Skin is not jaundiced.     Findings: No rash.  Neurological:     General: No focal deficit present.     Mental Status: He is alert and oriented to person, place, and time. Mental status is at baseline.     Cranial Nerves: No cranial nerve deficit.  Psychiatric:        Mood and Affect: Mood normal.        Behavior: Behavior normal.        Thought Content: Thought content normal.        Judgment: Judgment normal.    LABS:      Latest Ref Rng & Units 12/29/2022   12:00 AM 07/03/2022    2:09 PM 12/19/2021   12:00 AM  CBC  WBC  5.5     5.8  5.6      Hemoglobin 13.5 - 17.5 14.7     13.8  13.9      Hematocrit 41 - 53 43     43.0  43       Platelets 150 - 400 K/uL 216     224  218         This result is from an external source.      Latest Ref Rng & Units 12/29/2022   12:00 AM 07/03/2022    2:09 PM 12/19/2021   12:00 AM  CMP  Glucose 70 - 99 mg/dL  161    BUN 4 - 21 12     15  10       Creatinine 0.6 - 1.3 0.8     1.20  0.9      Sodium 137 - 147 140     138  142      Potassium 3.5 - 5.1 mEq/L 4.4     4.0  4.3      Chloride 99 - 108 106     105  103      CO2 13 - 22 25     28   34      Calcium 8.7 - 10.7 9.2     8.9  9.2      Total Protein 6.5 - 8.1 g/dL  6.7    Total Bilirubin 0.3 - 1.2 mg/dL  0.5    Alkaline Phos 25 - 125 69     53  62      AST 14 - 40 41     33  44      ALT 10 - 40 U/L 37     33  38         This result is from an external source.    Lab Results  Component Value Date   LDH 145 07/03/2022   LDH 398 06/22/2021   LDH 132 12/15/2020    STUDIES:     HISTORY:   Social History:  reports that he has never smoked. He has never used smokeless tobacco. He reports that he does  not drink alcohol and does not use drugs.The patient is alone today.  Allergies: No Known Allergies  Current Medications: Current Outpatient Medications  Medication Sig Dispense Refill   diclofenac (VOLTAREN) 75 MG EC tablet Take 75 mg by mouth 2 (two) times daily as needed.     finasteride (PROSCAR) 5 MG tablet Take 5 mg by mouth daily.     levothyroxine (SYNTHROID) 25 MCG tablet Take 25 mcg by mouth at bedtime.     Multiple Vitamin (MULTIVITAMIN WITH MINERALS) TABS tablet Take 1 tablet by mouth daily. Multivitamin with minerals/herbal complex 2 tabs daily     Omeprazole Magnesium 20.6 (20 Base) MG CPDR Take by mouth.     simvastatin (ZOCOR) 20 MG tablet Take 20 mg by mouth at bedtime.     Wheat Dextrin (BENEFIBER DRINK MIX PO) Take 1 Dose by mouth daily. 2 spoonfulls daily     No current facility-administered medications for this visit.     ASSESSMENT & PLAN:  Assessment/Plan:   Stage IIA diffuse large B-cell  lymphoma Status post R-CHOP chemotherapy and consolidative radiation therapy. There is a stable mesenteric soft tissue mass and adjacent soft tissue lesion without evidence of progression on the most recent CT imaging, consistent with treated lymphoma.  This has remained stable for the last several years now.   Plan   He had restaging scans at this time which look good.  There still is a soft tissue mass in the central small bowel mesentery which is unchanged and no definite adenopathy.  He also has multiple small calculi in the bladder but no obstruction or hydronephrosis.  I think we can go to yearly follow-up.  We will plan to see him back in 1 year with a CBC, comprehensive metabolic panel, LDH and CT chest, abdomen and pelvis. The patient understands the plans discussed today and is in agreement with them.  He knows to contact our office if he develops concerns prior to his next appointment.  I provided 30 minutes of face-to-face time during this this encounter and > 50% was spent counseling as documented under my assessment and plan.    Dellia Beckwith, MD    [facility]  [facility] [address] [place] [address] Dept: [phone] Dept Fax: [phone]    Rulon Sera Lassiter,acting as a scribe for Dellia Beckwith, MD.,have documented all relevant documentation on the behalf of Dellia Beckwith, MD,as directed by  Dellia Beckwith, MD while in the presence of Dellia Beckwith, MD.

## 2023-01-03 ENCOUNTER — Encounter: Payer: Self-pay | Admitting: Oncology

## 2023-01-03 ENCOUNTER — Inpatient Hospital Stay: Payer: Medicare HMO | Attending: Oncology | Admitting: Oncology

## 2023-01-03 ENCOUNTER — Other Ambulatory Visit: Payer: Self-pay | Admitting: Oncology

## 2023-01-03 VITALS — BP 155/84 | HR 77 | Temp 98.6°F | Resp 18 | Ht 71.0 in | Wt 188.6 lb

## 2023-01-03 DIAGNOSIS — C8333 Diffuse large B-cell lymphoma, intra-abdominal lymph nodes: Secondary | ICD-10-CM | POA: Diagnosis not present

## 2023-01-15 ENCOUNTER — Encounter: Payer: Self-pay | Admitting: Oncology

## 2023-03-28 NOTE — Telephone Encounter (Signed)
Created in error

## 2023-04-12 DIAGNOSIS — L820 Inflamed seborrheic keratosis: Secondary | ICD-10-CM | POA: Diagnosis not present

## 2023-04-12 DIAGNOSIS — L814 Other melanin hyperpigmentation: Secondary | ICD-10-CM | POA: Diagnosis not present

## 2023-04-12 DIAGNOSIS — L570 Actinic keratosis: Secondary | ICD-10-CM | POA: Diagnosis not present

## 2023-05-29 DIAGNOSIS — I10 Essential (primary) hypertension: Secondary | ICD-10-CM | POA: Diagnosis not present

## 2023-05-29 DIAGNOSIS — N400 Enlarged prostate without lower urinary tract symptoms: Secondary | ICD-10-CM | POA: Diagnosis not present

## 2023-05-29 DIAGNOSIS — Z23 Encounter for immunization: Secondary | ICD-10-CM | POA: Diagnosis not present

## 2023-05-29 DIAGNOSIS — E785 Hyperlipidemia, unspecified: Secondary | ICD-10-CM | POA: Diagnosis not present

## 2023-05-29 DIAGNOSIS — M1991 Primary osteoarthritis, unspecified site: Secondary | ICD-10-CM | POA: Diagnosis not present

## 2023-05-29 DIAGNOSIS — K2960 Other gastritis without bleeding: Secondary | ICD-10-CM | POA: Diagnosis not present

## 2023-05-29 DIAGNOSIS — Z0000 Encounter for general adult medical examination without abnormal findings: Secondary | ICD-10-CM | POA: Diagnosis not present

## 2023-05-29 DIAGNOSIS — R7302 Impaired glucose tolerance (oral): Secondary | ICD-10-CM | POA: Diagnosis not present

## 2023-05-29 DIAGNOSIS — C8330 Diffuse large B-cell lymphoma, unspecified site: Secondary | ICD-10-CM | POA: Diagnosis not present

## 2023-05-29 DIAGNOSIS — I700 Atherosclerosis of aorta: Secondary | ICD-10-CM | POA: Diagnosis not present

## 2023-05-29 DIAGNOSIS — E039 Hypothyroidism, unspecified: Secondary | ICD-10-CM | POA: Diagnosis not present

## 2023-06-12 DIAGNOSIS — N210 Calculus in bladder: Secondary | ICD-10-CM | POA: Diagnosis not present

## 2023-06-12 DIAGNOSIS — N401 Enlarged prostate with lower urinary tract symptoms: Secondary | ICD-10-CM | POA: Diagnosis not present

## 2023-06-12 DIAGNOSIS — R972 Elevated prostate specific antigen [PSA]: Secondary | ICD-10-CM | POA: Diagnosis not present

## 2023-11-20 NOTE — Addendum Note (Signed)
 Addended byDyane Dustman on: 11/20/2023 01:30 PM   Modules accepted: Orders

## 2023-12-11 DIAGNOSIS — R972 Elevated prostate specific antigen [PSA]: Secondary | ICD-10-CM | POA: Diagnosis not present

## 2023-12-11 DIAGNOSIS — N401 Enlarged prostate with lower urinary tract symptoms: Secondary | ICD-10-CM | POA: Diagnosis not present

## 2023-12-27 ENCOUNTER — Ambulatory Visit (HOSPITAL_BASED_OUTPATIENT_CLINIC_OR_DEPARTMENT_OTHER)
Admission: RE | Admit: 2023-12-27 | Discharge: 2023-12-27 | Disposition: A | Discharge status: Home / Self Care | Source: Ambulatory Visit | Attending: Oncology | Admitting: Oncology

## 2023-12-27 DIAGNOSIS — Z9049 Acquired absence of other specified parts of digestive tract: Secondary | ICD-10-CM | POA: Diagnosis not present

## 2023-12-27 DIAGNOSIS — I700 Atherosclerosis of aorta: Secondary | ICD-10-CM | POA: Diagnosis not present

## 2023-12-27 DIAGNOSIS — C8333 Diffuse large B-cell lymphoma, intra-abdominal lymph nodes: Secondary | ICD-10-CM

## 2023-12-27 MED ORDER — IOHEXOL 300 MG/ML  SOLN
100.0000 mL | Freq: Once | INTRAMUSCULAR | Status: AC | PRN
Start: 2023-12-27 — End: 2023-12-27
  Administered 2023-12-27: 100 mL via INTRAVENOUS

## 2024-01-02 ENCOUNTER — Other Ambulatory Visit: Payer: Self-pay | Admitting: Oncology

## 2024-01-02 DIAGNOSIS — C8333 Diffuse large B-cell lymphoma, intra-abdominal lymph nodes: Secondary | ICD-10-CM

## 2024-01-02 NOTE — Progress Notes (Signed)
 [facility]  [address] [place],  [place]  [address] [phone]  Clinic Day: 01/03/24   Referring physician: Audrea Blender, Renford Cartwright, *  CHIEF COMPLAINT:  CC:   Stage IIA diffuse large B-cell lymphoma  Current Treatment:   Observation   HISTORY OF PRESENT ILLNESS:  Thomas Lucas is a 73 y.o. 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  He underwent open repair with mesh placement and was found to have an incidental mass involving the distal hernia sac and vas deferens.  Numerous stains were negative but this did stain positive for leukocyte common antigen and CD 45.  Additional stains showed positivity for CD 20, CD 10, BCL 6, BCL 2, CD 21, and CD 30.  This is felt to be can consistent with a diffuse large B-cell lymphoma of germinal center origin, high-grade.  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  CT chest, abdomen, and pelvis revealed a large nodal mass within the mesentery measuring 10.8 x 7.1 by 13.5 cm and a portacaval node measuring 2.8 cm, as well as an abnormal soft tissue infiltration within the retrocrural and retroperitoneal region of the abdomen and encasing the abdominal aorta measuring 3.3 cm x 4.6 cm and is 9.4 cm in length.  There were also multiple stones in the dependent portion of the bladder.  He was found to have iron deficiency anemia and was treated with IV iron in the form of Feraheme in March 2019.  He received 6 cycles of R-CHOP chemotherapy completed in July 2019 with good response.  He tolerated treatment fairly well but did have worsening anemia, neuropathy of the toes, lower extremity edema and hypokalemia.  He was given furosemide 20 mg daily as needed for the lower extremity edema and was placed on potassium chloride 20 mEq daily.  Furosemide and potassium were subsequently discontinued.  He also had an  episode of acute cholecystitis and required cholecystectomy at the end of July.  He had residual disease in the abdomen on CT imaging in July, so received consolidation radiation to the mesenteric mass completed in October 2019.  PET scan in December revealed a persistent mesenteric mass with a lower SUV than previously.    CT abdomen and pelvis in April 2020 revealed a persistent, but stable soft tissue density in the central small bowel mesentery encasing the mesenteric vessels and measuring over 7 cm in maximum diameter.  There was no evidence of new or progressive disease and no other adenopathy.  There were multiple bladder calculi. CT chest, abdomen and pelvis in August revealed a slight decrease in the small bowel mesenteric nodal mass now measuring 7.3 by 2.4 cm with a stable adjacent soft tissue lesion measuring 2.1 cm.  There was no evidence of new or progressive disease. There were persistent bladder calculi.  There were changes in the liver consistent with hepatic steatosis. CT chest, abdomen and pelvis in February 2021 revealed continued decrease in the central mesenteric soft tissue mass measuring 6.9 by 1.7 cm.  CT chest, abdomen and pelvis in October 2021 revealed stable matted soft tissue density in the small bowel mesentery consistent with treated lymphoma.  Repeat CT scans in October 2022 revealed the same.  INTERVAL HISTORY:  Thomas Lucas is here today for repeat clinical assessment for her stage IIA diffuse large B-cell lymphoma, now 6 years  out. Patient states that he feels well but complains of body soreness from yard work. He had a CT chest, abdomen, and pelvis done on 12/27/2023 which revealed similar amorphous soft tissue along the mesenteric root, no pathologically enlarged lymph nodes in the chest, abdomen or pelvis and no splenomegaly. However, a 9 mm focus of enhancement in the peripheral right lobe of the liver, possibly reflecting an intrahepatic shunt but nonspecific was seen. We  discussed whether to consider a MRI scan and I find this lesion barely visible and am comfortable waiting until his next scan. He has a WBC of 5.2, hemoglobin of 14.2, and platelet count of 232,000. His CMP is completely normal and his LDH today is pending. I will see him back in 1 year with CBC, CMP, LDH, CT chest, abdomen, and pelvis.   He denies fever, chills, night sweats, or other signs of infection. He denies cardiorespiratory and gastrointestinal issues. His appetite is good and his weight has decreased 2 pounds over last year.       REVIEW OF SYSTEMS:  Review of Systems  Constitutional: Negative.  Negative for appetite change, chills, diaphoresis, fatigue, fever and unexpected weight change.  HENT:  Negative.  Negative for hearing loss, lump/mass, mouth sores, nosebleeds, sore throat, tinnitus, trouble swallowing and voice change.   Eyes: Negative.  Negative for eye problems and icterus.  Respiratory: Negative.  Negative for chest tightness, cough, hemoptysis, shortness of breath and wheezing.   Cardiovascular: Negative.  Negative for chest pain, leg swelling and palpitations.  Gastrointestinal: Negative.  Negative for abdominal distention, abdominal pain, blood in stool, constipation, diarrhea, nausea, rectal pain and vomiting.  Endocrine: Negative.  Negative for hot flashes.  Genitourinary: Negative.  Negative for bladder incontinence, difficulty urinating, dyspareunia, dysuria, frequency, hematuria, nocturia, pelvic pain and penile discharge.   Musculoskeletal: Negative.  Negative for arthralgias, back pain, flank pain, gait problem, myalgias, neck pain and neck stiffness.       Body sore, from yard work  Skin: Negative.  Negative for itching, rash and wound.  Neurological: Negative.  Negative for dizziness, extremity weakness, gait problem, headaches, light-headedness, numbness, seizures and speech difficulty.  Hematological: Negative.  Negative for adenopathy. Does not bruise/bleed  easily.  Psychiatric/Behavioral: Negative.  Negative for confusion, decreased concentration, depression, sleep disturbance and suicidal ideas. The patient is not nervous/anxious.     VITALS:  Blood pressure (!) 142/68, pulse 75, temperature 97.8 F (36.6 C), temperature source Oral, resp. rate 18, height 5\' 11"  (1.803 m), weight 186 lb 11.2 oz (84.7 kg), SpO2 98%.  Wt Readings from Last 3 Encounters:  01/03/24 186 lb 11.2 oz (84.7 kg)  01/03/23 188 lb 9.6 oz (85.5 kg)  07/03/22 185 lb 14.4 oz (84.3 kg)    Body mass index is 26.04 kg/m.  Performance status (ECOG): 0 - Asymptomatic  PHYSICAL EXAM:  Physical Exam Vitals and nursing note reviewed.  Constitutional:      General: He is not in acute distress.    Appearance: Normal appearance. He is normal weight. He is not ill-appearing, toxic-appearing or diaphoretic.  HENT:     Head: Normocephalic and atraumatic.     Right Ear: Tympanic membrane, ear canal and external ear normal. There is no impacted cerumen.     Left Ear: Tympanic membrane, ear canal and external ear normal. There is no impacted cerumen.     Nose: Nose normal. No congestion or rhinorrhea.     Mouth/Throat:     Mouth: Mucous membranes are moist.  Pharynx: Oropharynx is clear. No oropharyngeal exudate or posterior oropharyngeal erythema.  Eyes:     General: No scleral icterus.       Right eye: No discharge.        Left eye: No discharge.     Extraocular Movements: Extraocular movements intact.     Conjunctiva/sclera: Conjunctivae normal.     Pupils: Pupils are equal, round, and reactive to light.  Cardiovascular:     Rate and Rhythm: Normal rate and regular rhythm.     Pulses: Normal pulses.     Heart sounds: Normal heart sounds. No murmur heard.    No friction rub. No gallop.  Pulmonary:     Effort: Pulmonary effort is normal. No respiratory distress.     Breath sounds: Normal breath sounds. No stridor. No wheezing, rhonchi or rales.  Chest:     Chest  wall: No tenderness.  Abdominal:     General: Bowel sounds are normal. There is no distension.     Palpations: Abdomen is soft. There is no hepatomegaly, splenomegaly or mass.     Tenderness: There is no abdominal tenderness. There is no right CVA tenderness, left CVA tenderness, guarding or rebound.     Hernia: No hernia is present.  Musculoskeletal:        General: No swelling, tenderness, deformity or signs of injury. Normal range of motion.     Cervical back: Normal range of motion and neck supple. No tenderness.     Right lower leg: No edema.     Left lower leg: No edema.  Lymphadenopathy:     Cervical: No cervical adenopathy.     Upper Body:     Right upper body: No supraclavicular or axillary adenopathy.     Left upper body: No supraclavicular or axillary adenopathy.     Lower Body: No right inguinal adenopathy. No left inguinal adenopathy.  Skin:    General: Skin is warm and dry.     Coloration: Skin is not jaundiced or pale.     Findings: No bruising, erythema, lesion or rash.  Neurological:     General: No focal deficit present.     Mental Status: He is alert and oriented to person, place, and time. Mental status is at baseline.     Cranial Nerves: No cranial nerve deficit.     Sensory: No sensory deficit.     Motor: No weakness.     Coordination: Coordination normal.     Gait: Gait normal.     Deep Tendon Reflexes: Reflexes normal.  Psychiatric:        Mood and Affect: Mood normal.        Behavior: Behavior normal.        Thought Content: Thought content normal.        Judgment: Judgment normal.    LABS:      Latest Ref Rng & Units 01/03/2024    9:58 AM 12/29/2022   12:00 AM 07/03/2022    2:09 PM  CBC  WBC 4.0 - 10.5 K/uL 5.2  5.5     5.8   Hemoglobin 13.0 - 17.0 g/dL 40.9  81.1     91.4   Hematocrit 39.0 - 52.0 % 42.5  43     43.0   Platelets 150 - 400 K/uL 232  216     224      This result is from an external source.      Latest Ref Rng & Units 01/03/2024  9:58 AM 12/29/2022   12:00 AM 07/03/2022    2:09 PM  CMP  Glucose 70 - 99 mg/dL 914   782   BUN 8 - 23 mg/dL 11  12     15    Creatinine 0.61 - 1.24 mg/dL 9.56  0.8     2.13   Sodium 135 - 145 mmol/L 141  140     138   Potassium 3.5 - 5.1 mmol/L 3.9  4.4     4.0   Chloride 98 - 111 mmol/L 103  106     105   CO2 22 - 32 mmol/L 26  25     28    Calcium 8.9 - 10.3 mg/dL 9.9  9.2     8.9   Total Protein 6.5 - 8.1 g/dL 7.1   6.7   Total Bilirubin 0.0 - 1.2 mg/dL 0.2   0.5   Alkaline Phos 38 - 126 U/L 61  69     53   AST 15 - 41 U/L 26  41     33   ALT 0 - 44 U/L 20  37     33      This result is from an external source.    Lab Results  Component Value Date   LDH 156 01/03/2024   LDH 145 07/03/2022   LDH 398 06/22/2021    STUDIES:  EXAM: 12/27/2023 CT CHEST, ABDOMEN, AND PELVIS WITH CONTRAST IMPRESSION: 1. Similar amorphous soft tissue along the mesenteric root, nonspecific but favored to reflect treated lymphoma. 2. No pathologically enlarged lymph nodes in the chest, abdomen or pelvis. 3. No splenomegaly. 4. 9 mm focus of enhancement in the peripheral right lobe of the liver, possibly reflecting an intrahepatic shunt but nonspecific. Consider further evaluation by pre and postcontrast enhanced abdominal MRI.  HISTORY:   Social History:  reports that he has never smoked. He has never used smokeless tobacco. He reports that he does not drink alcohol and does not use drugs.The patient is alone today.  Allergies: No Known Allergies  Current Medications: Current Outpatient Medications  Medication Sig Dispense Refill   lisinopril (ZESTRIL) 5 MG tablet Take 5 mg by mouth every morning.     diclofenac (VOLTAREN) 75 MG EC tablet Take 75 mg by mouth 2 (two) times daily as needed.     finasteride (PROSCAR) 5 MG tablet Take 5 mg by mouth daily.     levothyroxine (SYNTHROID) 25 MCG tablet Take 25 mcg by mouth at bedtime.     Multiple Vitamin (MULTIVITAMIN WITH MINERALS) TABS tablet Take  1 tablet by mouth daily. Multivitamin with minerals/herbal complex 2 tabs daily     Omeprazole Magnesium 20.6 (20 Base) MG CPDR Take by mouth.     simvastatin (ZOCOR) 20 MG tablet Take 20 mg by mouth at bedtime.     Wheat Dextrin (BENEFIBER DRINK MIX PO) Take 1 Dose by mouth daily. 2 spoonfulls daily     No current facility-administered medications for this visit.   ASSESSMENT & PLAN:  Assessment/Plan:   Stage IIA diffuse large B-cell lymphoma Status post R-CHOP chemotherapy and consolidative radiation therapy. There is a stable mesenteric soft tissue mass and adjacent soft tissue lesion without evidence of progression on the most recent CT imaging, consistent with treated lymphoma.  This has remained stable for the last several years now.  Focus of Enhancement in the Peripheral Right Lobe of the Liver This was not seen on prior scans but I find it  barely visible at this time and not suspicious for recurrent lymphoma. We discussed the possibility of an MRI scan and we both feel it is unnecessary, we will just recheck this area on next years repeat CT scan.   Plan He had a CT chest, abdomen, and pelvis done on 12/27/2023 which revealed similar amorphous soft tissue along the mesenteric root, no pathologically enlarged lymph nodes in the chest, abdomen or pelvis and no splenomegaly. However, a 9 mm focus of enhancement in the peripheral right lobe of the liver, possibly reflecting an intrahepatic shunt but nonspecific was seen. We discussed whether to consider a MRI scan and I find this lesion barely visible and am comfortable waiting until his next scan. He has a WBC of 5.2, hemoglobin of 14.2, and platelet count of 232,000. His CMP is completely normal and his LDH today is pending. I will see him back in 1 year with CBC, CMP, LDH, CT chest, abdomen, and pelvis. The patient understands the plans discussed today and is in agreement with them.  He knows to contact our office if he develops concerns  prior to his next appointment.  I provided 17 minutes of face-to-face time during this this encounter and > 50% was spent counseling as documented under my assessment and plan.    Nolia Baumgartner, MD  [facility] OF [facility] [address] [place] [address] Dept: [phone] Dept Fax: [phone]   No orders of the defined types were placed in this encounter.  I,Jasmine M Lassiter,acting as a scribe for Nolia Baumgartner, MD.,have documented all relevant documentation on the behalf of Nolia Baumgartner, MD,as directed by  Nolia Baumgartner, MD while in the presence of Nolia Baumgartner, MD.

## 2024-01-03 ENCOUNTER — Inpatient Hospital Stay

## 2024-01-03 ENCOUNTER — Other Ambulatory Visit: Payer: Self-pay | Admitting: Oncology

## 2024-01-03 ENCOUNTER — Inpatient Hospital Stay: Payer: Medicare HMO | Attending: Oncology | Admitting: Oncology

## 2024-01-03 ENCOUNTER — Encounter: Payer: Self-pay | Admitting: Oncology

## 2024-01-03 VITALS — BP 142/68 | HR 75 | Temp 97.8°F | Resp 18 | Ht 71.0 in | Wt 186.7 lb

## 2024-01-03 DIAGNOSIS — Z9221 Personal history of antineoplastic chemotherapy: Secondary | ICD-10-CM | POA: Insufficient documentation

## 2024-01-03 DIAGNOSIS — Z923 Personal history of irradiation: Secondary | ICD-10-CM | POA: Diagnosis not present

## 2024-01-03 DIAGNOSIS — C8333 Diffuse large B-cell lymphoma, intra-abdominal lymph nodes: Secondary | ICD-10-CM

## 2024-01-03 LAB — CBC WITH DIFFERENTIAL (CANCER CENTER ONLY)
Abs Immature Granulocytes: 0.01 10*3/uL (ref 0.00–0.07)
Basophils Absolute: 0 10*3/uL (ref 0.0–0.1)
Basophils Relative: 1 %
Eosinophils Absolute: 0.1 10*3/uL (ref 0.0–0.5)
Eosinophils Relative: 2 %
HCT: 42.5 % (ref 39.0–52.0)
Hemoglobin: 14.2 g/dL (ref 13.0–17.0)
Immature Granulocytes: 0 %
Lymphocytes Relative: 34 %
Lymphs Abs: 1.8 10*3/uL (ref 0.7–4.0)
MCH: 32.1 pg (ref 26.0–34.0)
MCHC: 33.4 g/dL (ref 30.0–36.0)
MCV: 95.9 fL (ref 80.0–100.0)
Monocytes Absolute: 0.5 10*3/uL (ref 0.1–1.0)
Monocytes Relative: 10 %
Neutro Abs: 2.8 10*3/uL (ref 1.7–7.7)
Neutrophils Relative %: 53 %
Platelet Count: 232 10*3/uL (ref 150–400)
RBC: 4.43 MIL/uL (ref 4.22–5.81)
RDW: 13 % (ref 11.5–15.5)
WBC Count: 5.2 10*3/uL (ref 4.0–10.5)
nRBC: 0 % (ref 0.0–0.2)

## 2024-01-03 LAB — CMP (CANCER CENTER ONLY)
ALT: 20 U/L (ref 0–44)
AST: 26 U/L (ref 15–41)
Albumin: 4.3 g/dL (ref 3.5–5.0)
Alkaline Phosphatase: 61 U/L (ref 38–126)
Anion gap: 11 (ref 5–15)
BUN: 11 mg/dL (ref 8–23)
CO2: 26 mmol/L (ref 22–32)
Calcium: 9.9 mg/dL (ref 8.9–10.3)
Chloride: 103 mmol/L (ref 98–111)
Creatinine: 1.01 mg/dL (ref 0.61–1.24)
GFR, Estimated: >60 mL/min (ref >60)
Glucose, Bld: 116 mg/dL — ABNORMAL HIGH (ref 70–99)
Potassium: 3.9 mmol/L (ref 3.5–5.1)
Sodium: 141 mmol/L (ref 135–145)
Total Bilirubin: 0.2 mg/dL (ref 0.0–1.2)
Total Protein: 7.1 g/dL (ref 6.5–8.1)

## 2024-01-03 LAB — LACTATE DEHYDROGENASE: LDH: 156 U/L (ref 98–192)

## 2024-06-06 DIAGNOSIS — Z1331 Encounter for screening for depression: Secondary | ICD-10-CM | POA: Diagnosis not present

## 2024-06-06 DIAGNOSIS — Z23 Encounter for immunization: Secondary | ICD-10-CM | POA: Diagnosis not present

## 2024-06-06 DIAGNOSIS — C8330 Diffuse large B-cell lymphoma, unspecified site: Secondary | ICD-10-CM | POA: Diagnosis not present

## 2024-06-06 DIAGNOSIS — Z1339 Encounter for screening examination for other mental health and behavioral disorders: Secondary | ICD-10-CM | POA: Diagnosis not present

## 2024-06-06 DIAGNOSIS — M1991 Primary osteoarthritis, unspecified site: Secondary | ICD-10-CM | POA: Diagnosis not present

## 2024-06-06 DIAGNOSIS — K2960 Other gastritis without bleeding: Secondary | ICD-10-CM | POA: Diagnosis not present

## 2024-06-06 DIAGNOSIS — I10 Essential (primary) hypertension: Secondary | ICD-10-CM | POA: Diagnosis not present

## 2024-06-06 DIAGNOSIS — E785 Hyperlipidemia, unspecified: Secondary | ICD-10-CM | POA: Diagnosis not present

## 2024-06-06 DIAGNOSIS — Z131 Encounter for screening for diabetes mellitus: Secondary | ICD-10-CM | POA: Diagnosis not present

## 2024-06-06 DIAGNOSIS — E039 Hypothyroidism, unspecified: Secondary | ICD-10-CM | POA: Diagnosis not present

## 2024-06-06 DIAGNOSIS — Z0000 Encounter for general adult medical examination without abnormal findings: Secondary | ICD-10-CM | POA: Diagnosis not present

## 2024-06-06 DIAGNOSIS — Z9181 History of falling: Secondary | ICD-10-CM | POA: Diagnosis not present

## 2024-06-11 DIAGNOSIS — R351 Nocturia: Secondary | ICD-10-CM | POA: Diagnosis not present

## 2024-06-11 DIAGNOSIS — R972 Elevated prostate specific antigen [PSA]: Secondary | ICD-10-CM | POA: Diagnosis not present

## 2024-06-11 DIAGNOSIS — N401 Enlarged prostate with lower urinary tract symptoms: Secondary | ICD-10-CM | POA: Diagnosis not present

## 2024-12-25 ENCOUNTER — Other Ambulatory Visit

## 2025-01-01 ENCOUNTER — Ambulatory Visit: Admitting: Oncology
